# Patient Record
Sex: Female | Born: 1960 | Race: Black or African American | Hispanic: No | Smoking: Never smoker
Health system: Southern US, Community
[De-identification: ages and names within clinical notes are randomized; demographics above are authoritative.]

## PROBLEM LIST (undated history)

## (undated) DIAGNOSIS — N879 Dysplasia of cervix uteri, unspecified: Secondary | ICD-10-CM

## (undated) DIAGNOSIS — I10 Essential (primary) hypertension: Secondary | ICD-10-CM

## (undated) HISTORY — PX: OOPHORECTOMY: SHX86

## (undated) HISTORY — PX: BREAST SURGERY: SHX581

## (undated) HISTORY — PX: COLPOSCOPY: SHX161

## (undated) HISTORY — DX: Essential (primary) hypertension: I10

## (undated) HISTORY — DX: Dysplasia of cervix uteri, unspecified: N87.9

## (undated) HISTORY — PX: HERNIA REPAIR: SHX51

---

## 1992-09-06 HISTORY — PX: OTHER SURGICAL HISTORY: SHX169

## 1997-12-23 ENCOUNTER — Other Ambulatory Visit: Admission: RE | Admit: 1997-12-23 | Discharge: 1997-12-23 | Payer: Self-pay | Admitting: *Deleted

## 1998-10-10 ENCOUNTER — Other Ambulatory Visit: Admission: RE | Admit: 1998-10-10 | Discharge: 1998-10-10 | Payer: Self-pay | Admitting: *Deleted

## 2000-01-11 ENCOUNTER — Other Ambulatory Visit: Admission: RE | Admit: 2000-01-11 | Discharge: 2000-01-11 | Payer: Self-pay | Admitting: *Deleted

## 2002-04-09 ENCOUNTER — Other Ambulatory Visit: Admission: RE | Admit: 2002-04-09 | Discharge: 2002-04-09 | Payer: Self-pay | Admitting: *Deleted

## 2003-04-03 ENCOUNTER — Other Ambulatory Visit: Admission: RE | Admit: 2003-04-03 | Discharge: 2003-04-03 | Payer: Self-pay | Admitting: *Deleted

## 2008-01-28 ENCOUNTER — Emergency Department (HOSPITAL_COMMUNITY): Admission: EM | Admit: 2008-01-28 | Discharge: 2008-01-28 | Payer: Self-pay | Admitting: Family Medicine

## 2008-09-06 HISTORY — PX: OTHER SURGICAL HISTORY: SHX169

## 2008-11-04 ENCOUNTER — Ambulatory Visit (HOSPITAL_BASED_OUTPATIENT_CLINIC_OR_DEPARTMENT_OTHER): Admission: RE | Admit: 2008-11-04 | Discharge: 2008-11-05 | Payer: Self-pay | Admitting: Specialist

## 2008-11-04 ENCOUNTER — Encounter (INDEPENDENT_AMBULATORY_CARE_PROVIDER_SITE_OTHER): Payer: Self-pay | Admitting: Specialist

## 2008-11-26 ENCOUNTER — Ambulatory Visit: Payer: Self-pay | Admitting: Gynecology

## 2009-03-11 ENCOUNTER — Ambulatory Visit: Payer: Self-pay | Admitting: Gynecology

## 2009-03-11 ENCOUNTER — Encounter: Payer: Self-pay | Admitting: Gynecology

## 2009-03-11 ENCOUNTER — Other Ambulatory Visit: Admission: RE | Admit: 2009-03-11 | Discharge: 2009-03-11 | Payer: Self-pay | Admitting: Gynecology

## 2010-03-16 ENCOUNTER — Ambulatory Visit: Payer: Self-pay | Admitting: Gynecology

## 2010-04-08 ENCOUNTER — Other Ambulatory Visit: Admission: RE | Admit: 2010-04-08 | Discharge: 2010-04-08 | Payer: Self-pay | Admitting: Gynecology

## 2010-04-08 ENCOUNTER — Ambulatory Visit: Payer: Self-pay | Admitting: Gynecology

## 2010-08-27 ENCOUNTER — Ambulatory Visit: Payer: Self-pay | Admitting: Gynecology

## 2010-09-25 ENCOUNTER — Ambulatory Visit
Admission: RE | Admit: 2010-09-25 | Discharge: 2010-09-25 | Payer: Self-pay | Source: Home / Self Care | Attending: Gynecology | Admitting: Gynecology

## 2011-01-19 NOTE — Op Note (Signed)
[name], Julie Frederick              ACCOUNT NO.:  [id number]   MEDICAL RECORD NO.:  [id number]          PATIENT TYPE:  AMB   LOCATION:  DSC                          FACILITY:  [facility]   PHYSICIAN:  Earvin Hansen L. 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  She also has in the midline area from  previous surgery, hysterectomy, weakness in the mid portion consistent  with possible ventral herniation.  She also has 4 plus diastasis recti  and lipodystrophy.   PROCEDURE:  Panniculectomy with repair of severe diastasis recti, repair  of ventral hernia.   ANESTHESIA:  General.   Preoperatively, the patient was stood up and drawn for the W-plasty  incision area within the folds of her abdomen.  She underwent general  anesthesia and intubated orally.  A sterile Foley catheter technique was  used.  After prep with sterile towels, tumescent fluid was injected  within the abdominal skin and subcutaneous tissue, a total of 1000 mL.  The W-plasty incision was opened with #15 blade.  The incision was  carried down to Scarpa fascia to underlying areolar tissue overlying the  rectus fascia.  Using the Bovie unit on coagulation, I used it to  dissect over the area up to the umbilicus.  The umbilicus now released  on its own pedicle and the dissection was carried out to the xiphoid  process as well as the right and left upper quadrants.  In the midline  area it was observed a defect measured approximately 4 x 8.5 cm with  weakness.  Since this was below the arcuate line, I was able to plicate  it back together with multiple sutures of 0 Surgilene.   Next, after proper hemostasis, the liposuction was fashioned out  laterally for the lipodystrophy at present.  The severe diastasis recti  was repaired  with a running #1 Prolene from the xiphoid process to the  umbilicus.  This is from the umbilicus to the pubic area.  Attention  then was drawn to the patient.  The patient is placed in Jack-Knife  position.  Excess panniculus was then trimmed.  Hemostasis maintained  with Bovie anticoagulation and then the fascia were laid in and secured  with deep suture of 2-0 Monocryl x2 layers and a running subcuticular  stitches of 3-0 Monocryl.  The wounds were drained with a large Hemovac  drain which was brought to the pubic hair area and threaded throughout  the disk space of the abdominal space and secured with 3-0 Prolene.  The  wounds were cleansed.  The belly button was brought back through and  secured with 2-0 Monocryl subcu and then a running 4-0 Prolene.  Steri-  Strips and sterile dressings were applied including Xeroform, 4 x 4s,  ABDs, and Hypafix tape as well as abdominal support.  She withstood the  procedures very well and was taken to recovery room in  good condition.   ESTIMATED BLOOD LOSS:  Less than 150 mL.   COMPLICATIONS:  None.     Yaakov Guthrie. Shon Hough, M.D.  Electronically Signed    GLT/MEDQ  D:  11/04/2008  T:  11/05/2008  Job:  [id number]

## 2011-03-23 ENCOUNTER — Ambulatory Visit (INDEPENDENT_AMBULATORY_CARE_PROVIDER_SITE_OTHER): Payer: BC Managed Care – PPO | Admitting: Gynecology

## 2011-03-23 DIAGNOSIS — K644 Residual hemorrhoidal skin tags: Secondary | ICD-10-CM

## 2011-03-23 DIAGNOSIS — N898 Other specified noninflammatory disorders of vagina: Secondary | ICD-10-CM

## 2011-03-23 DIAGNOSIS — B373 Candidiasis of vulva and vagina: Secondary | ICD-10-CM

## 2011-06-02 LAB — POCT URINALYSIS DIP (DEVICE)
Bilirubin Urine: NEGATIVE
Ketones, ur: NEGATIVE
Protein, ur: 100 — AB
pH: 7

## 2011-06-18 ENCOUNTER — Ambulatory Visit (INDEPENDENT_AMBULATORY_CARE_PROVIDER_SITE_OTHER): Payer: BC Managed Care – PPO | Admitting: Gynecology

## 2011-06-18 ENCOUNTER — Encounter: Payer: Self-pay | Admitting: Gynecology

## 2011-06-18 VITALS — BP 132/90

## 2011-06-18 DIAGNOSIS — N3941 Urge incontinence: Secondary | ICD-10-CM

## 2011-06-18 DIAGNOSIS — N3281 Overactive bladder: Secondary | ICD-10-CM

## 2011-06-18 DIAGNOSIS — N318 Other neuromuscular dysfunction of bladder: Secondary | ICD-10-CM

## 2011-06-18 DIAGNOSIS — B373 Candidiasis of vulva and vagina: Secondary | ICD-10-CM

## 2011-06-18 DIAGNOSIS — R350 Frequency of micturition: Secondary | ICD-10-CM

## 2011-06-18 DIAGNOSIS — Z23 Encounter for immunization: Secondary | ICD-10-CM

## 2011-06-18 DIAGNOSIS — N898 Other specified noninflammatory disorders of vagina: Secondary | ICD-10-CM

## 2011-06-18 MED ORDER — FLUCONAZOLE 150 MG PO TABS: 150.0000 mg | ORAL_TABLET | Freq: Once | ORAL | Status: AC

## 2011-06-18 NOTE — Progress Notes (Signed)
Addended by: Bertram Savin A on: 06/18/2011 03:20 PM   Modules accepted: Orders

## 2011-06-18 NOTE — Patient Instructions (Signed)
We will call you on Monday it is any growth from the urine culture. Please read the information provided on overactive bladder if the symptoms persist we will give you a trial of the medication Gala Murdoch which she will take 1 tablet daily. I would likely to keep a urinary diary and followup to see me in one month after having started medication.

## 2011-06-18 NOTE — Progress Notes (Signed)
Patient is a 50 year old who presented to the office today stating that over the past week she's had more urinary frequency than usual and also progressively has been having nocturia. She does state that she does have urgency and incontinence which she's had that for quite some time especially even after voiding if she exercises she'll leak a little bit of urine. She denies any dysuria very little discharge no change in sexual partner denies any fever chills nausea vomiting no back discomfort and has normal bowel movements. 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 Literature information was provided on overactive bladder. She will wait to start the medication if and only when the results of the urine culture next week shows that there is no organisms present. She will then return to the office one month after initiating treatment for followup. The wet prep did demonstrate monilia assess and she was given a prescription Diflucan 150 mg.

## 2011-07-27 ENCOUNTER — Telehealth: Payer: Self-pay | Admitting: *Deleted

## 2011-07-27 DIAGNOSIS — N390 Urinary tract infection, site not specified: Secondary | ICD-10-CM

## 2011-07-27 NOTE — Telephone Encounter (Signed)
Patient with signs and symptoms of urinary tract infection. We'll place her on Macrobid one by mouth twice a day for 7 day. We'll also calling Uribell antispasmodic agent to take 1 tablet by mouth 4 times a day for 2 days.

## 2011-07-27 NOTE — Telephone Encounter (Signed)
Pt called c/o UTI s/s frequent urination, cloudy urine. She has no lower back pain nor burning with urination. She was seen on 06/18/11 and u/a result were negative. She was give diflucan for yeast and took as directed. Pt states she was told that if her symptoms should persist that antibody would sent to pharmacy. She is not taking an birth control and had a hysterectomy. Please advise.

## 2011-07-28 MED ORDER — NITROFURANTOIN MONOHYD MACRO 100 MG PO CAPS: 100.0000 mg | ORAL_CAPSULE | Freq: Two times a day (BID) | ORAL | Status: AC

## 2011-07-28 MED ORDER — URIBEL 118 MG PO CAPS: ORAL_CAPSULE | ORAL | Status: DC

## 2011-07-28 NOTE — Telephone Encounter (Signed)
Lm on pt vm that rx has been sent to pharmacy.

## 2011-09-02 ENCOUNTER — Encounter: Payer: Self-pay | Admitting: Gynecology

## 2011-09-02 ENCOUNTER — Ambulatory Visit (INDEPENDENT_AMBULATORY_CARE_PROVIDER_SITE_OTHER): Payer: BC Managed Care – PPO | Admitting: Gynecology

## 2011-09-02 VITALS — BP 136/92

## 2011-09-02 DIAGNOSIS — B373 Candidiasis of vulva and vagina: Secondary | ICD-10-CM

## 2011-09-02 DIAGNOSIS — R82998 Other abnormal findings in urine: Secondary | ICD-10-CM

## 2011-09-02 DIAGNOSIS — N390 Urinary tract infection, site not specified: Secondary | ICD-10-CM

## 2011-09-02 DIAGNOSIS — N898 Other specified noninflammatory disorders of vagina: Secondary | ICD-10-CM

## 2011-09-02 DIAGNOSIS — N760 Acute vaginitis: Secondary | ICD-10-CM

## 2011-09-02 DIAGNOSIS — R300 Dysuria: Secondary | ICD-10-CM

## 2011-09-02 DIAGNOSIS — I10 Essential (primary) hypertension: Secondary | ICD-10-CM

## 2011-09-02 DIAGNOSIS — B9689 Other specified bacterial agents as the cause of diseases classified elsewhere: Secondary | ICD-10-CM

## 2011-09-02 DIAGNOSIS — A499 Bacterial infection, unspecified: Secondary | ICD-10-CM

## 2011-09-02 MED ORDER — TINIDAZOLE 500 MG PO TABS: 2.0000 g | ORAL_TABLET | Freq: Once | ORAL | Status: AC

## 2011-09-02 MED ORDER — SULFAMETHOXAZOLE-TMP DS 800-160 MG PO TABS: 1.0000 | ORAL_TABLET | Freq: Two times a day (BID) | ORAL | Status: AC

## 2011-09-02 NOTE — Patient Instructions (Addendum)
Take antibiotics as prescribed. Follow up if symptoms persist or return.  Follow blood pressure. It remains elevated then you'll need to see a primary physician for follow up and possible treatment.

## 2011-09-02 NOTE — Progress Notes (Signed)
Patient presents with complaints of dysuria. She also noticed her urine is darker and has an odor. No low back pain, fevers chills, vaginal discharge or other symptoms.  Exam with chaperone present Blood pressure 136/92 Spine straight no CVA tenderness Pelvic external BUS vagina with white to yellowish discharge. Cervix normal. Bimanual without masses or tenderness  Assessment and plan: 1. Dysuria. UA is consistent with UTI. We'll treat with Septra DS 1 by mouth twice a day x3 days. Follow up if symptoms persist or recur. 2. Vaginal discharge. Wet was positive for BV. We'll treat with Tindamax 2 g daily x2 doses. Alcohol avoidance discussed. 3. I blood pressure. Patient knows to she's had her blood pressure checked several times and seems to be elevated. It is 136/92. I've asked her to follow with rechecks if it persists and she needs to see a primary to consider treatment. She does have a family history of hypertension she knows importance of follow up.

## 2011-12-10 ENCOUNTER — Ambulatory Visit (INDEPENDENT_AMBULATORY_CARE_PROVIDER_SITE_OTHER): Payer: BC Managed Care – PPO | Admitting: Gynecology

## 2011-12-10 ENCOUNTER — Encounter: Payer: Self-pay | Admitting: Gynecology

## 2011-12-10 VITALS — BP 138/84

## 2011-12-10 DIAGNOSIS — Z131 Encounter for screening for diabetes mellitus: Secondary | ICD-10-CM

## 2011-12-10 DIAGNOSIS — Z113 Encounter for screening for infections with a predominantly sexual mode of transmission: Secondary | ICD-10-CM

## 2011-12-10 DIAGNOSIS — Z13220 Encounter for screening for lipoid disorders: Secondary | ICD-10-CM

## 2011-12-10 NOTE — Patient Instructions (Signed)
Follow up for your annual exam next week and we'll discuss blood testing results.

## 2011-12-10 NOTE — Progress Notes (Signed)
Patient presents requesting HIV screening. Her long-term boyfriend was found to have an indeterminate HIV result and or further evaluating her in the patient is obviously concerned about this. She has no symptoms to suggest immunosuppression. She actually has an appointment next week to see me for her annual exam. I ordered a hepatitis B hepatitis C HIV RPR. I also ordered routine lab work to save her the second blood draw next week which comes for annual to include a CBC urinalysis glucose and lipid profile. Patient will follow up these results as well as for her annual exam next week.

## 2011-12-11 LAB — CBC WITH DIFFERENTIAL/PLATELET
Basophils Absolute: 0 K/uL (ref 0.0–0.1)
Basophils Relative: 1 % (ref 0–1)
Eosinophils Absolute: 0.1 K/uL (ref 0.0–0.7)
Eosinophils Relative: 2 % (ref 0–5)
HCT: 40.6 % (ref 36.0–46.0)
Hemoglobin: 13.3 g/dL (ref 12.0–15.0)
Lymphocytes Relative: 38 % (ref 12–46)
Lymphs Abs: 2.4 K/uL (ref 0.7–4.0)
MCH: 27.9 pg (ref 26.0–34.0)
MCHC: 32.8 g/dL (ref 30.0–36.0)
MCV: 85.3 fL (ref 78.0–100.0)
Monocytes Absolute: 0.4 K/uL (ref 0.1–1.0)
Monocytes Relative: 6 % (ref 3–12)
Neutro Abs: 3.3 K/uL (ref 1.7–7.7)
Neutrophils Relative %: 53 % (ref 43–77)
Platelets: 261 K/uL (ref 150–400)
RBC: 4.76 MIL/uL (ref 3.87–5.11)
RDW: 13.1 % (ref 11.5–15.5)
WBC: 6.3 K/uL (ref 4.0–10.5)

## 2011-12-11 LAB — URINALYSIS W MICROSCOPIC + REFLEX CULTURE
Bacteria, UA: NONE SEEN
Bilirubin Urine: NEGATIVE
Casts: NONE SEEN
Crystals: NONE SEEN
Glucose, UA: NEGATIVE mg/dL
Hgb urine dipstick: NEGATIVE
Ketones, ur: NEGATIVE mg/dL
Leukocytes, UA: NEGATIVE
Nitrite: NEGATIVE
Protein, ur: NEGATIVE mg/dL
Specific Gravity, Urine: 1.007 (ref 1.005–1.030)
Squamous Epithelial / HPF: NONE SEEN
Urobilinogen, UA: 0.2 mg/dL (ref 0.0–1.0)
pH: 6.5 (ref 5.0–8.0)

## 2011-12-11 LAB — LIPID PANEL
Cholesterol: 185 mg/dL (ref 0–200)
HDL: 48 mg/dL
Total CHOL/HDL Ratio: 3.9 ratio
Triglycerides: 422 mg/dL — ABNORMAL HIGH

## 2011-12-11 LAB — GLUCOSE, RANDOM: Glucose, Bld: 91 mg/dL (ref 70–99)

## 2011-12-11 LAB — SYPHILIS: RPR W/REFLEX TO RPR TITER AND TREPONEMAL ANTIBODIES, TRADITIONAL SCREENING AND DIAGNOSIS ALGORITHM

## 2011-12-11 LAB — HEPATITIS B SURFACE ANTIGEN: Hepatitis B Surface Ag: NEGATIVE

## 2011-12-11 LAB — HIV ANTIBODY (ROUTINE TESTING W REFLEX): HIV: NONREACTIVE

## 2011-12-11 LAB — HEPATITIS C ANTIBODY: HCV Ab: NEGATIVE

## 2011-12-13 ENCOUNTER — Telehealth: Payer: Self-pay | Admitting: *Deleted

## 2011-12-13 NOTE — Telephone Encounter (Signed)
Pt informed of all negative test result on 12/10/11.

## 2011-12-13 NOTE — Progress Notes (Signed)
Addended by: Dara Lords on: 12/13/2011 08:56 AM   Modules accepted: Orders

## 2011-12-17 ENCOUNTER — Encounter: Payer: BC Managed Care – PPO | Admitting: Gynecology

## 2011-12-17 ENCOUNTER — Other Ambulatory Visit (HOSPITAL_COMMUNITY)
Admission: RE | Admit: 2011-12-17 | Discharge: 2011-12-17 | Disposition: A | Discharge status: Home / Self Care | Payer: BC Managed Care – PPO | Source: Ambulatory Visit | Attending: Gynecology | Admitting: Gynecology

## 2011-12-17 ENCOUNTER — Other Ambulatory Visit: Payer: BC Managed Care – PPO

## 2011-12-17 ENCOUNTER — Ambulatory Visit (INDEPENDENT_AMBULATORY_CARE_PROVIDER_SITE_OTHER): Payer: BC Managed Care – PPO | Admitting: Gynecology

## 2011-12-17 ENCOUNTER — Encounter: Payer: Self-pay | Admitting: Gynecology

## 2011-12-17 VITALS — BP 128/82 | Ht 61.25 in | Wt 150.0 lb

## 2011-12-17 DIAGNOSIS — Z01419 Encounter for gynecological examination (general) (routine) without abnormal findings: Secondary | ICD-10-CM

## 2011-12-17 DIAGNOSIS — Z13220 Encounter for screening for lipoid disorders: Secondary | ICD-10-CM

## 2011-12-17 LAB — LIPID PANEL
LDL Cholesterol: 92 mg/dL (ref 0–99)
Triglycerides: 152 mg/dL — ABNORMAL HIGH (ref <150)

## 2011-12-17 MED ORDER — MEDROXYPROGESTERONE ACETATE 10 MG PO TABS: 10.0000 mg | ORAL_TABLET | Freq: Every day | ORAL | Status: DC

## 2011-12-17 MED ORDER — ESTROGENS CONJUGATED 0.9 MG PO TABS: 0.9000 mg | ORAL_TABLET | Freq: Every day | ORAL | Status: DC

## 2011-12-17 NOTE — Patient Instructions (Signed)
Follow up for triglyceride results. Otherwise follow up in one year for annual gynecologic exam.

## 2011-12-17 NOTE — Progress Notes (Signed)
Julie Frederick 05-01-1961 [id number]        51 y.o.  for annual exam.  Several issues noted below  Past medical history,surgical history, medications, allergies, family history and social history were all reviewed and documented in the EPIC chart. ROS:  Was performed and pertinent positives and negatives are included in the history.  Exam: Julie Frederick chaperone present Filed Vitals:   12/17/11 1008  BP: 128/82   General appearance  Normal Skin grossly normal Head/Neck normal with no cervical or supraclavicular adenopathy thyroid normal Lungs  clear Cardiac RR, without RMG Abdominal  soft, nontender, without masses, organomegaly or hernia Breasts  examined lying and sitting without masses, retractions, discharge or axillary adenopathy. Pelvic  Ext/BUS/vagina  normal   Cervix  normal  Pap done  Adnexa  Without masses or tenderness    Anus and perineum  normal   Rectovaginal  normal sphincter tone without palpated masses or tenderness.    Assessment/Plan:  51 y.o. female for annual exam.    1. HRT. Patient is on Premarin 0.9 and Provera 10 mg first 14 days of the month. She does have some spotting at the end of the Provera. She's doing well wants to continue. I reviewed options to include continuous progesterone to limit withdrawal bleeding and she is comfortable with her current situation. I reviewed the WHI study with increased risk of stroke heart attack DVT as well as increased risk of breast cancer. She is comfortable with these risks and wants to continue which I think is reasonable given her age and premature loss of ovarian function. I refilled her Premarin 0.9 #90 refill 4, Provera 10 mg #39 refill 4. 2. Pap smear. I did a Pap smear as she has not had one in 2 years. 3. Mammography. Patient had mammogram in October 2012 and she will repeat this coming fall. She'll continue with annual mammography. SBE monthly reviewed. 4. Colonoscopy. I recommended she arrange colonoscopy when she  turns 51 and she agrees to do this. 5. Partner with indeterminate HIV. Her recent HIV check was negative. Her partner will continue to follow up with his physicians to ultimately determine his status. 6. Elevated triglycerides. Her recent blood work showed a triglyceride in the 400 range. She repeated a fasting triglyceride this morning. If remains elevated, we will refer her to a primary physician to manage and she knows the plan. Otherwise assuming she continues well from a gynecologic standpoint she will see me in a year, sooner as needed.     Dara Lords MD, 10:33 AM 12/17/2011

## 2011-12-20 ENCOUNTER — Other Ambulatory Visit: Payer: Self-pay | Admitting: *Deleted

## 2011-12-20 DIAGNOSIS — E7800 Pure hypercholesterolemia, unspecified: Secondary | ICD-10-CM

## 2011-12-24 ENCOUNTER — Other Ambulatory Visit: Payer: BC Managed Care – PPO

## 2012-02-06 ENCOUNTER — Emergency Department (HOSPITAL_COMMUNITY): Payer: No Typology Code available for payment source

## 2012-02-06 ENCOUNTER — Encounter (HOSPITAL_COMMUNITY): Payer: Self-pay | Admitting: *Deleted

## 2012-02-06 ENCOUNTER — Emergency Department (HOSPITAL_COMMUNITY)
Admission: EM | Admit: 2012-02-06 | Discharge: 2012-02-06 | Disposition: A | Discharge status: Home / Self Care | Payer: No Typology Code available for payment source | Attending: Emergency Medicine | Admitting: Emergency Medicine

## 2012-02-06 DIAGNOSIS — IMO0002 Reserved for concepts with insufficient information to code with codable children: Secondary | ICD-10-CM | POA: Insufficient documentation

## 2012-02-06 DIAGNOSIS — Y92410 Unspecified street and highway as the place of occurrence of the external cause: Secondary | ICD-10-CM | POA: Insufficient documentation

## 2012-02-06 DIAGNOSIS — M542 Cervicalgia: Secondary | ICD-10-CM | POA: Insufficient documentation

## 2012-02-06 DIAGNOSIS — S0081XA Abrasion of other part of head, initial encounter: Secondary | ICD-10-CM

## 2012-02-06 DIAGNOSIS — S20219A Contusion of unspecified front wall of thorax, initial encounter: Secondary | ICD-10-CM | POA: Insufficient documentation

## 2012-02-06 MED ORDER — HYDROCODONE-ACETAMINOPHEN 5-325 MG PO TABS: 1.0000 | ORAL_TABLET | Freq: Four times a day (QID) | ORAL | Status: AC | PRN

## 2012-02-06 MED ORDER — HYDROCODONE-ACETAMINOPHEN 5-325 MG PO TABS
1.0000 | ORAL_TABLET | ORAL | Status: AC
  Administered 2012-02-06: 1 via ORAL
  Filled 2012-02-06: qty 1
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
## 2012-02-14 ENCOUNTER — Telehealth: Payer: Self-pay | Admitting: *Deleted

## 2012-02-14 NOTE — Telephone Encounter (Signed)
Pt called requesting PCP office number, I told [facility], pt said Julie Frederick is name TF gave her number given to set appointment.

## 2012-02-29 ENCOUNTER — Ambulatory Visit: Payer: No Typology Code available for payment source | Admitting: Physical Therapy

## 2012-02-29 ENCOUNTER — Ambulatory Visit: Payer: No Typology Code available for payment source | Attending: Family Medicine | Admitting: Physical Therapy

## 2012-02-29 DIAGNOSIS — M2550 Pain in unspecified joint: Secondary | ICD-10-CM | POA: Insufficient documentation

## 2012-02-29 DIAGNOSIS — IMO0001 Reserved for inherently not codable concepts without codable children: Secondary | ICD-10-CM | POA: Insufficient documentation

## 2012-02-29 DIAGNOSIS — M2560 Stiffness of unspecified joint, not elsewhere classified: Secondary | ICD-10-CM | POA: Insufficient documentation

## 2012-03-07 ENCOUNTER — Ambulatory Visit: Payer: No Typology Code available for payment source | Attending: Family Medicine | Admitting: Physical Therapy

## 2012-03-07 DIAGNOSIS — IMO0001 Reserved for inherently not codable concepts without codable children: Secondary | ICD-10-CM | POA: Insufficient documentation

## 2012-03-07 DIAGNOSIS — M2550 Pain in unspecified joint: Secondary | ICD-10-CM | POA: Insufficient documentation

## 2012-03-07 DIAGNOSIS — M2560 Stiffness of unspecified joint, not elsewhere classified: Secondary | ICD-10-CM | POA: Insufficient documentation

## 2012-03-13 ENCOUNTER — Encounter: Payer: BC Managed Care – PPO | Admitting: Physical Therapy

## 2012-03-13 ENCOUNTER — Ambulatory Visit: Payer: No Typology Code available for payment source | Admitting: Physical Therapy

## 2012-03-14 ENCOUNTER — Ambulatory Visit: Payer: No Typology Code available for payment source | Admitting: Rehabilitative and Restorative Service Providers"

## 2012-03-15 ENCOUNTER — Encounter: Payer: BC Managed Care – PPO | Admitting: Physical Therapy

## 2012-03-20 ENCOUNTER — Ambulatory Visit: Payer: No Typology Code available for payment source

## 2012-03-23 ENCOUNTER — Ambulatory Visit: Payer: No Typology Code available for payment source | Admitting: Physical Therapy

## 2012-03-27 ENCOUNTER — Ambulatory Visit: Payer: No Typology Code available for payment source | Admitting: Physical Therapy

## 2012-03-28 ENCOUNTER — Encounter: Payer: BC Managed Care – PPO | Admitting: Physical Therapy

## 2012-03-28 ENCOUNTER — Ambulatory Visit: Payer: No Typology Code available for payment source | Admitting: Physical Therapy

## 2012-04-03 ENCOUNTER — Ambulatory Visit: Payer: No Typology Code available for payment source | Admitting: Rehabilitation

## 2012-04-04 ENCOUNTER — Ambulatory Visit: Payer: No Typology Code available for payment source | Admitting: Physical Therapy

## 2012-04-11 ENCOUNTER — Ambulatory Visit: Payer: BC Managed Care – PPO | Admitting: Physical Therapy

## 2012-04-11 ENCOUNTER — Ambulatory Visit: Payer: BC Managed Care – PPO | Attending: Family Medicine | Admitting: Physical Therapy

## 2012-04-11 DIAGNOSIS — M2560 Stiffness of unspecified joint, not elsewhere classified: Secondary | ICD-10-CM | POA: Insufficient documentation

## 2012-04-11 DIAGNOSIS — M2550 Pain in unspecified joint: Secondary | ICD-10-CM | POA: Insufficient documentation

## 2012-04-11 DIAGNOSIS — IMO0001 Reserved for inherently not codable concepts without codable children: Secondary | ICD-10-CM | POA: Insufficient documentation

## 2012-04-12 ENCOUNTER — Ambulatory Visit: Payer: BC Managed Care – PPO | Admitting: Rehabilitative and Restorative Service Providers"

## 2012-04-17 ENCOUNTER — Ambulatory Visit: Payer: BC Managed Care – PPO

## 2012-04-25 ENCOUNTER — Ambulatory Visit: Payer: BC Managed Care – PPO | Admitting: Rehabilitative and Restorative Service Providers"

## 2012-04-28 ENCOUNTER — Ambulatory Visit: Payer: BC Managed Care – PPO | Admitting: Rehabilitation

## 2012-05-01 ENCOUNTER — Ambulatory Visit: Payer: BC Managed Care – PPO | Admitting: Rehabilitative and Restorative Service Providers"

## 2012-05-02 ENCOUNTER — Encounter: Payer: BC Managed Care – PPO | Admitting: Rehabilitation

## 2012-05-03 ENCOUNTER — Other Ambulatory Visit: Payer: Self-pay | Admitting: Gynecology

## 2012-05-10 ENCOUNTER — Ambulatory Visit: Payer: BC Managed Care – PPO | Attending: Family Medicine | Admitting: Physical Therapy

## 2012-05-10 DIAGNOSIS — M2560 Stiffness of unspecified joint, not elsewhere classified: Secondary | ICD-10-CM | POA: Insufficient documentation

## 2012-05-10 DIAGNOSIS — IMO0001 Reserved for inherently not codable concepts without codable children: Secondary | ICD-10-CM | POA: Insufficient documentation

## 2012-05-10 DIAGNOSIS — M2550 Pain in unspecified joint: Secondary | ICD-10-CM | POA: Insufficient documentation

## 2012-06-13 ENCOUNTER — Encounter: Payer: Self-pay | Admitting: Gynecology

## 2012-09-05 ENCOUNTER — Encounter: Payer: Self-pay | Admitting: Gynecology

## 2012-09-05 ENCOUNTER — Ambulatory Visit (INDEPENDENT_AMBULATORY_CARE_PROVIDER_SITE_OTHER): Payer: BC Managed Care – PPO | Admitting: Gynecology

## 2012-09-05 DIAGNOSIS — N949 Unspecified condition associated with female genital organs and menstrual cycle: Secondary | ICD-10-CM

## 2012-09-05 DIAGNOSIS — R102 Pelvic and perineal pain: Secondary | ICD-10-CM

## 2012-09-05 DIAGNOSIS — IMO0002 Reserved for concepts with insufficient information to code with codable children: Secondary | ICD-10-CM

## 2012-09-05 DIAGNOSIS — Z79890 Hormone replacement therapy (postmenopausal): Secondary | ICD-10-CM

## 2012-09-05 DIAGNOSIS — N898 Other specified noninflammatory disorders of vagina: Secondary | ICD-10-CM

## 2012-09-05 LAB — URINALYSIS W MICROSCOPIC + REFLEX CULTURE
Bilirubin Urine: NEGATIVE
Casts: NONE SEEN
Glucose, UA: NEGATIVE mg/dL
Ketones, ur: NEGATIVE mg/dL
Leukocytes, UA: NEGATIVE
WBC, UA: NONE SEEN WBC/hpf (ref <3)
pH: 5.5 (ref 5.0–8.0)

## 2012-09-05 LAB — WET PREP FOR TRICH, YEAST, CLUE: Yeast Wet Prep HPF POC: NONE SEEN

## 2012-09-05 MED ORDER — ESTROGENS CONJUGATED 0.9 MG PO TABS: 0.9000 mg | ORAL_TABLET | Freq: Every day | ORAL | Status: DC

## 2012-09-05 MED ORDER — MEDROXYPROGESTERONE ACETATE 10 MG PO TABS: 10.0000 mg | ORAL_TABLET | Freq: Every day | ORAL | Status: DC

## 2012-09-05 NOTE — Patient Instructions (Signed)
Follow up for ultrasound as scheduled 

## 2012-09-05 NOTE — Progress Notes (Signed)
Patient presents with two-day history of pelvic pressure.  Not sure is due to abdominal exercises she is doing or something else. No urinary symptoms such as frequency dysuria or urgency. Slight discharge no odor or itching. Is on HRT to include monthly Provera withdrawal. She has not done this though for several months. No bleeding. She is status post supracervical hysterectomy BSO in the past.  Exam with block assistant Spine straight no CVA tenderness Abdomen soft nontender without masses guarding rebound organomegaly Pelvic external BUS vagina normal. Cervix normal. Bimanual is some fullness at the cervix/cuff. No tenderness. Rectovaginal normal.  Assessment and plan: Some lower pelvic pressure. Urinalysis unremarkable. Wet prep negative. Questionable fullness at cervix. We'll start with ultrasound for better definition. Patient will follow for this and we'll go from there. She does note some deep dyspareunia with deep penetration.  I refilled her HRT for several months as she is due in April for her annual. Next rest and need to take the intermittent progesterone withdrawal she does have some spotting after taking the progesterone.

## 2012-09-05 NOTE — Addendum Note (Signed)
Addended by: Bertram Savin A on: 09/05/2012 12:55 PM   Modules accepted: Orders

## 2012-09-07 LAB — URINE CULTURE: Colony Count: NO GROWTH

## 2012-09-25 ENCOUNTER — Ambulatory Visit (INDEPENDENT_AMBULATORY_CARE_PROVIDER_SITE_OTHER): Payer: BC Managed Care – PPO | Admitting: Gynecology

## 2012-09-25 ENCOUNTER — Ambulatory Visit (INDEPENDENT_AMBULATORY_CARE_PROVIDER_SITE_OTHER): Payer: BC Managed Care – PPO

## 2012-09-25 ENCOUNTER — Encounter: Payer: Self-pay | Admitting: Gynecology

## 2012-09-25 DIAGNOSIS — N888 Other specified noninflammatory disorders of cervix uteri: Secondary | ICD-10-CM

## 2012-09-25 DIAGNOSIS — N9983 Residual ovary syndrome: Secondary | ICD-10-CM

## 2012-09-25 DIAGNOSIS — R102 Pelvic and perineal pain: Secondary | ICD-10-CM

## 2012-09-25 DIAGNOSIS — IMO0002 Reserved for concepts with insufficient information to code with codable children: Secondary | ICD-10-CM

## 2012-09-25 DIAGNOSIS — N72 Inflammatory disease of cervix uteri: Secondary | ICD-10-CM

## 2012-09-25 DIAGNOSIS — N838 Other noninflammatory disorders of ovary, fallopian tube and broad ligament: Secondary | ICD-10-CM

## 2012-09-25 DIAGNOSIS — N949 Unspecified condition associated with female genital organs and menstrual cycle: Secondary | ICD-10-CM

## 2012-09-25 NOTE — Patient Instructions (Signed)
Follow up as needed otherwise in one year for your annual exam.

## 2012-09-25 NOTE — Progress Notes (Signed)
Patient presents for ultrasound due to pelvic pressure symptoms. Also has deep dyspareunia. Ultrasound shows normal appearing cervical stump. No masses or cysts. 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 Left adnexa is negative.  Assessment and plan: Pelvic pressure symptoms which I think is due to exercise. She is having consistent deep dyspareunia with worse with certain positions. I reviewed options to include trachelectomy. The risks to include realistic risk of damage to surrounding structures such as bladder rectum and intestines discussed. Patient is not interested in surgery he would prefer just to monitor at present. I did review the right ovarian remnant with her. She had a TAH/BSO emergently after delivery due to hemorrhage which was felt to be due to a placenta accreta. It appears that a small portion of the right ovary remains. I discussed the long-term issues to include the risk of ovarian cancer with this and she understands and accepts this. Patient will follow per year for her annual see her as needed.

## 2013-04-05 ENCOUNTER — Ambulatory Visit (INDEPENDENT_AMBULATORY_CARE_PROVIDER_SITE_OTHER): Payer: BC Managed Care – PPO | Admitting: Gynecology

## 2013-04-05 ENCOUNTER — Encounter: Payer: Self-pay | Admitting: Gynecology

## 2013-04-05 DIAGNOSIS — R21 Rash and other nonspecific skin eruption: Secondary | ICD-10-CM

## 2013-04-05 MED ORDER — NYSTATIN-TRIAMCINOLONE 100000-0.1 UNIT/GM-% EX OINT: TOPICAL_OINTMENT | Freq: Two times a day (BID) | CUTANEOUS | Status: DC

## 2013-04-05 NOTE — Progress Notes (Signed)
Patient presents having noticed a rash under her left breast the past week or so. She's been putting nystatin cream on it and it seems to be getting better.  Exam was Julie Frederick Anterior chest/both breasts examined lying and sitting. Slight erythematous rash under the left breast consistent with fungal. No masses retractions discharge adenopathy bilaterally.  Assessment and plan: Fungal skin rash under left breast getting better. Mytrex cream prescribed to apply twice a day as needed. Followup if rash persists or recurs. Patient is overdue for her annual and she was reminded and encouraged to make that appointment today.

## 2013-04-05 NOTE — Patient Instructions (Signed)
Apply cream under breasts for the rash. Followup if it persists Schedule your annual exam as you are overdue.

## 2013-06-18 ENCOUNTER — Other Ambulatory Visit: Payer: Self-pay | Admitting: Gynecology

## 2013-06-20 ENCOUNTER — Telehealth: Payer: Self-pay | Admitting: *Deleted

## 2013-06-20 MED ORDER — NYSTATIN-TRIAMCINOLONE 100000-0.1 UNIT/GM-% EX OINT: TOPICAL_OINTMENT | Freq: Two times a day (BID) | CUTANEOUS | Status: DC

## 2013-06-20 NOTE — Telephone Encounter (Signed)
Pt was given Rx for mycology cream on OV 04/05/13, pt never picked up Rx, cream will be resent to pharmacy

## 2013-07-06 ENCOUNTER — Ambulatory Visit (INDEPENDENT_AMBULATORY_CARE_PROVIDER_SITE_OTHER): Payer: BC Managed Care – PPO | Admitting: Gynecology

## 2013-07-06 ENCOUNTER — Encounter: Payer: Self-pay | Admitting: Gynecology

## 2013-07-06 VITALS — BP 120/78 | Ht 60.0 in | Wt 149.0 lb

## 2013-07-06 DIAGNOSIS — Z01419 Encounter for gynecological examination (general) (routine) without abnormal findings: Secondary | ICD-10-CM

## 2013-07-06 DIAGNOSIS — Z79890 Hormone replacement therapy (postmenopausal): Secondary | ICD-10-CM

## 2013-07-06 MED ORDER — ESTROGENS CONJUGATED 0.9 MG PO TABS: 0.9000 mg | ORAL_TABLET | Freq: Every day | ORAL | Status: DC

## 2013-07-06 MED ORDER — MEDROXYPROGESTERONE ACETATE 10 MG PO TABS: 10.0000 mg | ORAL_TABLET | Freq: Every day | ORAL | Status: DC

## 2013-07-06 NOTE — Progress Notes (Signed)
Julie Frederick June 27, 1961 [id number]        52 y.o.  G1P1003 for annual exam.  Several issues noted below.  Past medical history,surgical history, problem list, medications, allergies, family history and social history were all reviewed and documented in the EPIC chart.  ROS:  Performed and pertinent positives and negatives are included in the history, assessment and plan .  Exam: Kim assistant Filed Vitals:   07/06/13 1544  BP: 120/78  Height: 5' (1.524 m)  Weight: 149 lb (67.586 kg)   General appearance  Normal Skin grossly normal Head/Neck normal with no cervical or supraclavicular adenopathy thyroid normal Lungs  clear Cardiac RR, without RMG Abdominal  soft, nontender, without masses, organomegaly or hernia Breasts  examined lying and sitting without masses, retractions, discharge or axillary adenopathy. Pelvic  Ext/BUS/vagina  normal  Cervix  normal  Adnexa  Without masses or tenderness    Anus and perineum  normal   Rectovaginal  normal sphincter tone without palpated masses or tenderness.    Assessment/Plan:  52 y.o. G2P1003 female for annual exam.   1. Status post supracervical hysterectomy BSO due to postpartum hemorrhage 1994 with placenta accreta. Is on HRT consisting of Premarin 0.9 mg daily and probe irritation milligrams first 14 days of each month. Does have spotting at the end of her Provera.  I again reviewed the whole issue of HRT with her to include the WHI study with increased risk of stroke, heart attack, DVT and breast cancer. The ACOG and NAMS statements for lowest dose for the shortest period of time reviewed. Transdermal versus oral first-pass effect benefit discussed.  Patient stoppable continuing at this point and wants to continue and I refilled her x1 year. 2. DEXA 2009 normal. Recommend repeat at age 52. Increase calcium vitamin D reviewed. 3. Mammography 06/2013. Recommend continue annual mammography. SBE monthly reviewed. 4. Pap smear 2013 normal.  No Pap smear done today. No history of abnormal Pap smear previously. Plan repeat Pap smear at 3 year interval. 5. Colonoscopy never. Recommended screening colonoscopy as she is over the age of 40 and she agrees to schedule this. 6. Health maintenance. Patient has routine blood work done through her work. Offered vitamin D TSH comprehensive metabolic panel which were not done but she declines. Followup one year, sooner as needed.   Note: This document was prepared with digital dictation and possible smart phrase technology. Any transcriptional errors that result from this process are unintentional.   Dara Lords MD, 4:39 PM 07/06/2013

## 2013-07-06 NOTE — Patient Instructions (Addendum)
Schedule colonoscopy with [facility] at [phone] or [facility] at [phone] Followup in one year for annual gynecologic exam.

## 2013-07-16 ENCOUNTER — Other Ambulatory Visit: Payer: Self-pay | Admitting: Gynecology

## 2013-09-03 ENCOUNTER — Encounter: Payer: Self-pay | Admitting: Women's Health

## 2013-09-03 ENCOUNTER — Ambulatory Visit (INDEPENDENT_AMBULATORY_CARE_PROVIDER_SITE_OTHER): Payer: BC Managed Care – PPO | Admitting: Women's Health

## 2013-09-03 DIAGNOSIS — N760 Acute vaginitis: Secondary | ICD-10-CM

## 2013-09-03 DIAGNOSIS — A499 Bacterial infection, unspecified: Secondary | ICD-10-CM

## 2013-09-03 DIAGNOSIS — B9689 Other specified bacterial agents as the cause of diseases classified elsewhere: Secondary | ICD-10-CM

## 2013-09-03 DIAGNOSIS — N949 Unspecified condition associated with female genital organs and menstrual cycle: Secondary | ICD-10-CM

## 2013-09-03 DIAGNOSIS — N898 Other specified noninflammatory disorders of vagina: Secondary | ICD-10-CM

## 2013-09-03 LAB — WET PREP FOR TRICH, YEAST, CLUE
Trich, Wet Prep: NONE SEEN
WBC, Wet Prep HPF POC: NONE SEEN

## 2013-09-03 MED ORDER — METRONIDAZOLE 0.75 % VA GEL: 1.0000 | Freq: Two times a day (BID) | VAGINAL | Status: DC

## 2013-09-03 NOTE — Progress Notes (Signed)
Patient ID: Julie Frederick, female   DOB: [birth year], 52 y.o.   MRN: [id number] Presents with complaint of vaginal discharge with odor and slight dyspareunia for 1 week. Denies urinary symptoms, abdominal pain or fever. Same partner. 1994 TAH supracervical for postpartum hemorrhage. On HRT.  Exam: Appears well. External genitalia within normal limits, and no visible erythema or discharge. Speculum exam scant discharge with no erythema noted. Wet prep positive for amines, clues, and TNTC bacteria. Bimanual no adnexal fullness or tenderness.  Bacteria vaginosis  Plan: MetroGel vaginal cream 1 applicator at bedtime x5, alcohol precautions reviewed. Instructed to call if no relief of discharge.

## 2013-09-03 NOTE — Patient Instructions (Signed)
Bacterial Vaginosis Bacterial vaginosis is an infection of the vagina. A healthy vagina has many kinds of good germs (bacteria). Sometimes the number of good germs can change. This allows bad germs to move in and cause an infection. You may be given medicine (antibiotics) to treat the infection. Or, you may not need treatment at all. HOME CARE  Take your medicine as told. Finish them even if you start to feel better.  Do not have sex until you finish your medicine.  Do not douche.  Practice safe sex.  Tell your sex partner that you have an infection. They should see their doctor for treatment if they have problems. GET HELP RIGHT AWAY IF:  You do not get better after 3 days of treatment.  You have grey fluid (discharge) coming from your vagina.  You have pain.  You have a temperature of 102 F (38.9 C) or higher. MAKE SURE YOU:   Understand these instructions.  Will watch your condition.  Will get help right away if you are not doing well or get worse. Document Released: 06/01/2008 Document Revised: 11/15/2011 Document Reviewed: 04/04/2013 ExitCare Patient Information 2014 [employer].  

## 2013-09-08 ENCOUNTER — Other Ambulatory Visit: Payer: Self-pay | Admitting: Gynecology

## 2013-12-10 ENCOUNTER — Telehealth: Payer: Self-pay | Admitting: *Deleted

## 2013-12-10 MED ORDER — ESTROGENS CONJUGATED 0.9 MG PO TABS: 0.9000 mg | ORAL_TABLET | Freq: Every day | ORAL | Status: DC

## 2013-12-10 NOTE — Telephone Encounter (Signed)
Pt called requesting 90 day supply for premarin vaginal cream, rx sent

## 2014-01-22 ENCOUNTER — Telehealth: Payer: Self-pay | Admitting: *Deleted

## 2014-01-22 NOTE — Telephone Encounter (Signed)
Ok, Dr Audie BoxFontaine had treated her in 2012 with septra DS twice daily for 3 days #6 - please call in and if not better office visit upon her return.

## 2014-01-22 NOTE — Telephone Encounter (Signed)
Pt informed, rx called in 

## 2014-01-22 NOTE — Telephone Encounter (Signed)
Pt called in [place] c/o early UTI, c/o continues urgency works at [employer], [place]. At [phone]

## 2014-01-23 MED ORDER — SULFAMETHOXAZOLE-TMP DS 800-160 MG PO TABS: 1.0000 | ORAL_TABLET | Freq: Two times a day (BID) | ORAL | Status: DC

## 2014-03-07 ENCOUNTER — Ambulatory Visit (INDEPENDENT_AMBULATORY_CARE_PROVIDER_SITE_OTHER): Payer: BC Managed Care – PPO | Admitting: Gynecology

## 2014-03-07 ENCOUNTER — Encounter: Payer: Self-pay | Admitting: Gynecology

## 2014-03-07 DIAGNOSIS — N393 Stress incontinence (female) (male): Secondary | ICD-10-CM

## 2014-03-07 DIAGNOSIS — R350 Frequency of micturition: Secondary | ICD-10-CM

## 2014-03-07 LAB — URINALYSIS W MICROSCOPIC + REFLEX CULTURE
BILIRUBIN URINE: NEGATIVE
CASTS: NONE SEEN
Crystals: NONE SEEN
GLUCOSE, UA: NEGATIVE mg/dL
Ketones, ur: NEGATIVE mg/dL
LEUKOCYTES UA: NEGATIVE
Nitrite: NEGATIVE
Protein, ur: NEGATIVE mg/dL
Specific Gravity, Urine: 1.01 (ref 1.005–1.030)
Urobilinogen, UA: 0.2 mg/dL (ref 0.0–1.0)
pH: 6 (ref 5.0–8.0)

## 2014-03-07 MED ORDER — SULFAMETHOXAZOLE-TRIMETHOPRIM 800-160 MG PO TABS: 1.0000 | ORAL_TABLET | Freq: Two times a day (BID) | ORAL | Status: DC

## 2014-03-07 MED ORDER — MEDROXYPROGESTERONE ACETATE 10 MG PO TABS: 10.0000 mg | ORAL_TABLET | Freq: Every day | ORAL | Status: DC

## 2014-03-07 NOTE — Patient Instructions (Signed)
Take antibiotic twice daily for 3 days. Call if your urinary symptoms persist.

## 2014-03-07 NOTE — Progress Notes (Signed)
Julie Frederick [birth year] [id number]        53 y.o.  G1P1003 presents with several week history of urinary incontinence with exercise and intercourse. Also some frequency with urination. Has a history of slight loss of urine consistent with stress incontinence. [facility]. Has been monitoring the symptoms seem worse over the last 2-3 weeks. No fever chills significant dysuria or pelvic pain.  Past medical history,surgical history, problem list, medications, allergies, family history and social history were all reviewed and documented in the EPIC chart.  Directed ROS with pertinent positives and negatives documented in the history of present illness/assessment and plan.  Exam: Kim assistant General appearance:  Normal Spine: Straight without CVA tenderness Abdomen: Soft nontender without masses guarding or rebound Pelvic: External BUS vagina normal. Cervix normal. Bimanual without masses or tenderness.  Assessment/Plan:  53 y.o. G1P1003 exacerbation of stress incontinence symptoms. Urinalysis suggest low-grade UTI with few bacteria 0-2 WBC 3-6 RBC. We'll cover with antibiotics Septra DS 1 by mouth twice a day x3 days. Followup on urine culture. Followup if symptoms persist, worsen or recur.   Note: This document was prepared with digital dictation and possible smart phrase technology. Any transcriptional errors that result from this process are unintentional.   Dara LordsFONTAINE,Julie Shi P MD, 9:48 AM 03/07/2014

## 2014-03-08 LAB — URINE CULTURE
COLONY COUNT: NO GROWTH
ORGANISM ID, BACTERIA: NO GROWTH

## 2014-03-11 ENCOUNTER — Other Ambulatory Visit: Payer: Self-pay | Admitting: Gynecology

## 2014-03-11 DIAGNOSIS — N3001 Acute cystitis with hematuria: Secondary | ICD-10-CM

## 2014-07-08 ENCOUNTER — Encounter: Payer: Self-pay | Admitting: Gynecology

## 2014-07-23 ENCOUNTER — Encounter: Payer: Self-pay | Admitting: Gynecology

## 2014-08-06 ENCOUNTER — Encounter: Payer: Self-pay | Admitting: Women's Health

## 2014-08-06 ENCOUNTER — Ambulatory Visit (INDEPENDENT_AMBULATORY_CARE_PROVIDER_SITE_OTHER): Payer: BC Managed Care – PPO | Admitting: Women's Health

## 2014-08-06 VITALS — BP 128/80 | Wt 141.0 lb

## 2014-08-06 DIAGNOSIS — B373 Candidiasis of vulva and vagina: Secondary | ICD-10-CM

## 2014-08-06 DIAGNOSIS — A499 Bacterial infection, unspecified: Secondary | ICD-10-CM

## 2014-08-06 DIAGNOSIS — B3731 Acute candidiasis of vulva and vagina: Secondary | ICD-10-CM

## 2014-08-06 DIAGNOSIS — N760 Acute vaginitis: Secondary | ICD-10-CM

## 2014-08-06 DIAGNOSIS — R350 Frequency of micturition: Secondary | ICD-10-CM

## 2014-08-06 DIAGNOSIS — B9689 Other specified bacterial agents as the cause of diseases classified elsewhere: Secondary | ICD-10-CM

## 2014-08-06 LAB — URINALYSIS W MICROSCOPIC + REFLEX CULTURE
Bilirubin Urine: NEGATIVE
CASTS: NONE SEEN
Crystals: NONE SEEN
GLUCOSE, UA: NEGATIVE mg/dL
KETONES UR: NEGATIVE mg/dL
LEUKOCYTES UA: NEGATIVE
Nitrite: NEGATIVE
PH: 7 (ref 5.0–8.0)
Protein, ur: NEGATIVE mg/dL
Specific Gravity, Urine: 1.01 (ref 1.005–1.030)
Urobilinogen, UA: 0.2 mg/dL (ref 0.0–1.0)
WBC, UA: NONE SEEN WBC/hpf (ref <3)

## 2014-08-06 LAB — WET PREP FOR TRICH, YEAST, CLUE: Trich, Wet Prep: NONE SEEN

## 2014-08-06 MED ORDER — FLUCONAZOLE 150 MG PO TABS: 150.0000 mg | ORAL_TABLET | Freq: Once | ORAL | Status: DC

## 2014-08-06 MED ORDER — METRONIDAZOLE 0.75 % VA GEL: VAGINAL | Status: DC

## 2014-08-06 NOTE — Patient Instructions (Signed)
Bacterial Vaginosis Bacterial vaginosis is an infection of the vagina. It happens when too many of certain germs (bacteria) grow in the vagina. HOME CARE  Take your medicine as told by your doctor.  Finish your medicine even if you start to feel better.  Do not have sex until you finish your medicine and are better.  Tell your sex partner that you have an infection. They should see their doctor for treatment.  Practice safe sex. Use condoms. Have only one sex partner. GET HELP IF:  You are not getting better after 3 days of treatment.  You have more grey fluid (discharge) coming from your vagina than before.  You have more pain than before.  You have a fever. MAKE SURE YOU:   Understand these instructions.  Will watch your condition.  Will get help right away if you are not doing well or get worse. Document Released: 06/01/2008 Document Revised: 06/13/2013 Document Reviewed: 04/04/2013 [facility] Patient Information 2015 [place] [place]. This information is not intended to replace advice given to you by your health care provider. Make sure you discuss any questions you have with your health care provider. bac

## 2014-08-06 NOTE — Progress Notes (Signed)
Patient ID: Julie Frederick, female   DOB: 07-10-1961, 53 y.o.   MRN: [id number] Presents with complaint of increased vaginal discharge with questionable bloody discharge. Denies urinary symptoms, abdominal pain or fever. 1994  supracervical TVH with BSO for postpartum hemorrhage. On Premarin 0.9 daily and Provera 10 day 1 through 13 every 2-3 months. Reports does have cramping with spotting after Provera.  Exam: Appears well. External genitalia within normal limits, speculum exam cervix healthy, scant discharge, wet prep positive for few yeast, few clue cells, TNTC bacteria. Bimanual no CMT. UA: Trace blood, no wbc's, 0-2 RBCs, rare bacteria.  Bacteria vaginosis Yeast vaginosis  Plan: MetroGel vaginal cream 1 applicator at bedtime 5, alcohol precautions reviewed. Diflucan 150 by mouth 1 dose. Instructed to call if no relief of discharge.

## 2014-08-19 ENCOUNTER — Other Ambulatory Visit: Payer: Self-pay | Admitting: Gynecology

## 2014-08-27 ENCOUNTER — Telehealth: Payer: Self-pay | Admitting: *Deleted

## 2014-08-27 NOTE — Telephone Encounter (Signed)
Pt called c/o coughing only for about 1 week now, pt said she is coughing up greenish mucus. Called requesting Rx, I explained to pt best to see PCP, has no PCP, traveling now in [place]. I advise pt to stop by urgent care to been seen for treatment, pt agreed.

## 2014-10-03 ENCOUNTER — Other Ambulatory Visit: Payer: Self-pay | Admitting: Gynecology

## 2014-10-03 ENCOUNTER — Telehealth: Payer: Self-pay | Admitting: *Deleted

## 2014-10-03 MED ORDER — ESTROGENS CONJUGATED 0.9 MG PO TABS: 0.9000 mg | ORAL_TABLET | Freq: Every day | ORAL | Status: DC

## 2014-10-03 NOTE — Telephone Encounter (Signed)
Pt has annual scheduled on 11/14/14 requesting refill on premarin 0.9 mg, Rx sent.

## 2014-11-14 ENCOUNTER — Other Ambulatory Visit (HOSPITAL_COMMUNITY)
Admission: RE | Admit: 2014-11-14 | Discharge: 2014-11-14 | Disposition: A | Discharge status: Home / Self Care | Payer: BLUE CROSS/BLUE SHIELD | Source: Ambulatory Visit | Attending: Gynecology | Admitting: Gynecology

## 2014-11-14 ENCOUNTER — Encounter: Payer: Self-pay | Admitting: Gynecology

## 2014-11-14 ENCOUNTER — Ambulatory Visit (INDEPENDENT_AMBULATORY_CARE_PROVIDER_SITE_OTHER): Payer: BLUE CROSS/BLUE SHIELD | Admitting: Gynecology

## 2014-11-14 VITALS — BP 126/80 | Ht 61.0 in | Wt 155.0 lb

## 2014-11-14 DIAGNOSIS — Z79890 Hormone replacement therapy (postmenopausal): Secondary | ICD-10-CM

## 2014-11-14 DIAGNOSIS — Z01419 Encounter for gynecological examination (general) (routine) without abnormal findings: Secondary | ICD-10-CM | POA: Insufficient documentation

## 2014-11-14 LAB — LIPID PANEL
CHOL/HDL RATIO: 2.5 ratio
CHOLESTEROL: 140 mg/dL (ref 0–200)
HDL: 55 mg/dL (ref >=46)
LDL Cholesterol: 53 mg/dL (ref 0–99)
TRIGLYCERIDES: 162 mg/dL — AB (ref <150)
VLDL: 32 mg/dL (ref 0–40)

## 2014-11-14 LAB — CBC WITH DIFFERENTIAL/PLATELET
BASOS PCT: 0 % (ref 0–1)
Basophils Absolute: 0 10*3/uL (ref 0.0–0.1)
EOS ABS: 0.1 10*3/uL (ref 0.0–0.7)
EOS PCT: 2 % (ref 0–5)
HCT: 38.3 % (ref 36.0–46.0)
Hemoglobin: 12.8 g/dL (ref 12.0–15.0)
LYMPHS PCT: 39 % (ref 12–46)
Lymphs Abs: 2 10*3/uL (ref 0.7–4.0)
MCH: 28 pg (ref 26.0–34.0)
MCHC: 33.4 g/dL (ref 30.0–36.0)
MCV: 83.8 fL (ref 78.0–100.0)
MPV: 9.6 fL (ref 8.6–12.4)
Monocytes Absolute: 0.5 10*3/uL (ref 0.1–1.0)
Monocytes Relative: 9 % (ref 3–12)
NEUTROS ABS: 2.6 10*3/uL (ref 1.7–7.7)
Neutrophils Relative %: 50 % (ref 43–77)
PLATELETS: 225 10*3/uL (ref 150–400)
RBC: 4.57 MIL/uL (ref 3.87–5.11)
RDW: 14 % (ref 11.5–15.5)
WBC: 5.1 10*3/uL (ref 4.0–10.5)

## 2014-11-14 LAB — COMPREHENSIVE METABOLIC PANEL
ALK PHOS: 34 U/L — AB (ref 39–117)
ALT: 25 U/L (ref 0–35)
AST: 31 U/L (ref 0–37)
Albumin: 4.1 g/dL (ref 3.5–5.2)
BUN: 15 mg/dL (ref 6–23)
CO2: 28 mEq/L (ref 19–32)
Calcium: 9.1 mg/dL (ref 8.4–10.5)
Chloride: 102 mEq/L (ref 96–112)
Creat: 0.98 mg/dL (ref 0.50–1.10)
GLUCOSE: 69 mg/dL — AB (ref 70–99)
POTASSIUM: 4.2 meq/L (ref 3.5–5.3)
Sodium: 139 mEq/L (ref 135–145)
Total Bilirubin: 0.3 mg/dL (ref 0.2–1.2)
Total Protein: 6.5 g/dL (ref 6.0–8.3)

## 2014-11-14 MED ORDER — ESTROGENS CONJUGATED 0.9 MG PO TABS: 0.9000 mg | ORAL_TABLET | Freq: Every day | ORAL | Status: DC

## 2014-11-14 MED ORDER — MEDROXYPROGESTERONE ACETATE 10 MG PO TABS: 10.0000 mg | ORAL_TABLET | Freq: Every day | ORAL | Status: DC

## 2014-11-14 NOTE — Patient Instructions (Signed)
Schedule colonoscopy with  gastroenterology at [phone] or [facility] at [phone]  You may obtain a copy of any labs that were done today by logging onto MyChart as outlined in the instructions provided with your AVS (after visit summary). The office will not call with normal lab results but certainly if there are any significant abnormalities then we will contact you.   Health Maintenance, Female A healthy lifestyle and preventative care can promote health and wellness.  Maintain regular health, dental, and eye exams.  Eat a healthy diet. Foods like vegetables, fruits, whole grains, low-fat dairy products, and lean protein foods contain the nutrients you need without too many calories. Decrease your intake of foods high in solid fats, added sugars, and salt. Get information about a proper diet from your caregiver, if necessary.  Regular physical exercise is one of the most important things you can do for your health. Most adults should get at least 150 minutes of moderate-intensity exercise (any activity that increases your heart rate and causes you to sweat) each week. In addition, most adults need muscle-strengthening exercises on 2 or more days a week.   Maintain a healthy weight. The body mass index (BMI) is a screening tool to identify possible weight problems. It provides an estimate of body fat based on height and weight. Your caregiver can help determine your BMI, and can help you achieve or maintain a healthy weight. For adults 20 years and older:  A BMI below 18.5 is considered underweight.  A BMI of 18.5 to 24.9 is normal.  A BMI of 25 to 29.9 is considered overweight.  A BMI of 30 and above is considered obese.  Maintain normal blood lipids and cholesterol by exercising and minimizing your intake of saturated fat. Eat a balanced diet with plenty of fruits and vegetables. Blood tests for lipids and cholesterol should begin at age 20 and be repeated every  5 years. If your lipid or cholesterol levels are high, you are over 50, or you are a high risk for heart disease, you may need your cholesterol levels checked more frequently.Ongoing high lipid and cholesterol levels should be treated with medicines if diet and exercise are not effective.  If you smoke, find out from your caregiver how to quit. If you do not use tobacco, do not start.  Lung cancer screening is recommended for adults aged 55 80 years who are at high risk for developing lung cancer because of a history of smoking. Yearly low-dose computed tomography (CT) is recommended for people who have at least a 30-pack-year history of smoking and are a current smoker or have quit within the past 15 years. A pack year of smoking is smoking an average of 1 pack of cigarettes a day for 1 year (for example: 1 pack a day for 30 years or 2 packs a day for 15 years). Yearly screening should continue until the smoker has stopped smoking for at least 15 years. Yearly screening should also be stopped for people who develop a health problem that would prevent them from having lung cancer treatment.  If you are pregnant, do not drink alcohol. If you are breastfeeding, be very cautious about drinking alcohol. If you are not pregnant and choose to drink alcohol, do not exceed 1 drink per day. One drink is considered to be 12 ounces (355 mL) of beer, 5 ounces (148 mL) of wine, or 1.5 ounces (44 mL) of liquor.  Avoid use of street drugs. Do not share needles   with anyone. Ask for help if you need support or instructions about stopping the use of drugs.  High blood pressure causes heart disease and increases the risk of stroke. Blood pressure should be checked at least every 1 to 2 years. Ongoing high blood pressure should be treated with medicines, if weight loss and exercise are not effective.  If you are 55 to 53 years old, ask your caregiver if you should take aspirin to prevent strokes.  Diabetes screening  involves taking a blood sample to check your fasting blood sugar level. This should be done once every 3 years, after age 45, if you are within normal weight and without risk factors for diabetes. Testing should be considered at a younger age or be carried out more frequently if you are overweight and have at least 1 risk factor for diabetes.  Breast cancer screening is essential preventative care for women. You should practice "breast self-awareness." This means understanding the normal appearance and feel of your breasts and may include breast self-examination. Any changes detected, no matter how small, should be reported to a caregiver. Women in their 20s and 30s should have a clinical breast exam (CBE) by a caregiver as part of a regular health exam every 1 to 3 years. After age 40, women should have a CBE every year. Starting at age 40, women should consider having a mammogram (breast X-ray) every year. Women who have a family history of breast cancer should talk to their caregiver about genetic screening. Women at a high risk of breast cancer should talk to their caregiver about having an MRI and a mammogram every year.  Breast cancer gene (BRCA)-related cancer risk assessment is recommended for women who have family members with BRCA-related cancers. BRCA-related cancers include breast, ovarian, tubal, and peritoneal cancers. Having family members with these cancers may be associated with an increased risk for harmful changes (mutations) in the breast cancer genes BRCA1 and BRCA2. Results of the assessment will determine the need for genetic counseling and BRCA1 and BRCA2 testing.  The Pap test is a screening test for cervical cancer. Women should have a Pap test starting at age 21. Between ages 21 and 29, Pap tests should be repeated every 2 years. Beginning at age 30, you should have a Pap test every 3 years as long as the past 3 Pap tests have been normal. If you had a hysterectomy for a problem that  was not cancer or a condition that could lead to cancer, then you no longer need Pap tests. If you are between ages 65 and 70, and you have had normal Pap tests going back 10 years, you no longer need Pap tests. If you have had past treatment for cervical cancer or a condition that could lead to cancer, you need Pap tests and screening for cancer for at least 20 years after your treatment. If Pap tests have been discontinued, risk factors (such as a new sexual partner) need to be reassessed to determine if screening should be resumed. Some women have medical problems that increase the chance of getting cervical cancer. In these cases, your caregiver may recommend more frequent screening and Pap tests.  The human papillomavirus (HPV) test is an additional test that may be used for cervical cancer screening. The HPV test looks for the virus that can cause the cell changes on the cervix. The cells collected during the Pap test can be tested for HPV. The HPV test could be used to screen women aged   aged 60 years and older, and should be used in women of any age who have unclear Pap test results. After the age of 2, women should have HPV testing at the same frequency as a Pap test.  Colorectal cancer can be detected and often prevented. Most routine colorectal cancer screening begins at the age of 53 and continues through age 70. However, your caregiver may recommend screening at an earlier age if you have risk factors for colon cancer. On a yearly basis, your caregiver may provide home test kits to check for hidden blood in the stool. Use of a small camera at the end of a tube, to directly examine the colon (sigmoidoscopy or colonoscopy), can detect the earliest forms of colorectal cancer. Talk to your caregiver about this at age 51, when routine screening begins. Direct examination of the colon should be repeated every 5 to 10 years through age 29, unless early forms of pre-cancerous polyps or small growths  are found.  Hepatitis C blood testing is recommended for all people born from [birth year] through 1964 and any individual with known risks for hepatitis C.  Practice safe sex. Use condoms and avoid high-risk sexual practices to reduce the spread of sexually transmitted infections (STIs). Sexually active women aged 42 and younger should be checked for Chlamydia, which is a common sexually transmitted infection. Older women with new or multiple partners should also be tested for Chlamydia. Testing for other STIs is recommended if you are sexually active and at increased risk.  Osteoporosis is a disease in which the bones lose minerals and strength with aging. This can result in serious bone fractures. The risk of osteoporosis can be identified using a bone density scan. Women ages 44 and over and women at risk for fractures or osteoporosis should discuss screening with their caregivers. Ask your caregiver whether you should be taking a calcium supplement or vitamin D to reduce the rate of osteoporosis.  Menopause can be associated with physical symptoms and risks. Hormone replacement therapy is available to decrease symptoms and risks. You should talk to your caregiver about whether hormone replacement therapy is right for you.  Use sunscreen. Apply sunscreen liberally and repeatedly throughout the day. You should seek shade when your shadow is shorter than you. Protect yourself by wearing long sleeves, pants, a wide-brimmed hat, and sunglasses year round, whenever you are outdoors.  Notify your caregiver of new moles or changes in moles, especially if there is a change in shape or color. Also notify your caregiver if a mole is larger than the size of a pencil eraser.  Stay current with your immunizations. Document Released: 03/08/2011 Document Revised: 12/18/2012 Document Reviewed: 03/08/2011 [facility] Patient Information 2014 [place].

## 2014-11-14 NOTE — Addendum Note (Signed)
Addended by: Dayna BarkerGARDNER, Radiance Deady K on: 11/14/2014 12:43 PM   Modules accepted: Orders

## 2014-11-14 NOTE — Progress Notes (Signed)
Julie FleetVarita W Frederick 29-Jul-1961 [id number]        54 y.o.  G1P1003 for annual exam.  Several issues noted below.  Past medical history,surgical history, problem list, medications, allergies, family history and social history were all reviewed and documented as reviewed in the EPIC chart.  ROS:  Performed with pertinent positives and negatives included in the history, assessment and plan.   Additional significant findings :  none   Exam: [facility] personassistant Filed Vitals:   11/14/14 1158  BP: 126/80  Height: 5\' 1"  (1.549 m)  Weight: 155 lb (70.308 kg)   General appearance:  Normal affect, orientation and appearance. Skin: Grossly normal HEENT: Without gross lesions.  No cervical or supraclavicular adenopathy. Thyroid normal.  Lungs:  Clear without wheezing, rales or rhonchi Cardiac: RR, without RMG Abdominal:  Soft, nontender, without masses, guarding, rebound, organomegaly or hernia Breasts:  Examined lying and sitting without masses, retractions, discharge or axillary adenopathy. Pelvic:  Ext/BUS/vagina normal  Cervix normal. Pap/HPV  Uterus absent  Adnexa  Without masses or tenderness    Anus and perineum  Normal   Rectovaginal  Normal sphincter tone without palpated masses or tenderness.    Assessment/Plan:  54 y.o. 311P1003 female for annual exam.   1. Status post supracervical hysterectomy/BSO 1994 secondary to postpartum hemorrhage.  Continues on HRT to include Premarin 0.9 mg daily and Provera 10 mg for the first 13 days of each month. Has a little bit of staining at the end of her Provera. The issues of trying to wean now or continuing were reviewed. Stopping altogether or going to a lower dose discussed. Risks reviewed again to include increased risk of stroke, heart attack, DVT and breast cancer.  Patient's uncomfortable stopping at this point and wants to continue and does not want to "rock the boat". Refill times both provided. Call if any bleeding other than at the end of her  Provera. 2. Mammography 07/2014. Continue with annual mammography. SBE monthly reviewed.  3. Pap smear 12/2011. Pap smear done today.  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 Normal Pap smear since then. 4. DEXA 2009 normal. Will plan repeat further into the menopause. Check vitamin D level today. Patient is on HRT which is beneficial to her bones. 5. Colonoscopy never. I again recommended that she schedule a screening colonoscopy and she agrees to do so. Names and numbers provided. 6. Health maintenance. Baseline CBC, comprehensive metabolic panel, lipid profile, urinalysis ordered. Follow up in one year, sooner as needed. 1 year, sooner as needed.     Dara LordsFONTAINE,Kwamaine Cuppett P MD, 12:25 PM 11/14/2014

## 2014-11-15 LAB — URINALYSIS W MICROSCOPIC + REFLEX CULTURE
Bacteria, UA: NONE SEEN
Bilirubin Urine: NEGATIVE
CASTS: NONE SEEN
Crystals: NONE SEEN
Glucose, UA: NEGATIVE mg/dL
HGB URINE DIPSTICK: NEGATIVE
Ketones, ur: NEGATIVE mg/dL
NITRITE: NEGATIVE
PH: 6.5 (ref 5.0–8.0)
PROTEIN: NEGATIVE mg/dL
Specific Gravity, Urine: 1.005 (ref 1.005–1.030)
Squamous Epithelial / LPF: NONE SEEN
Urobilinogen, UA: 0.2 mg/dL (ref 0.0–1.0)

## 2014-11-15 LAB — VITAMIN D 25 HYDROXY (VIT D DEFICIENCY, FRACTURES): Vit D, 25-Hydroxy: 37 ng/mL (ref 30–100)

## 2014-11-16 LAB — URINE CULTURE: Colony Count: 60000

## 2014-11-19 LAB — CYTOLOGY - PAP

## 2014-12-13 ENCOUNTER — Other Ambulatory Visit: Payer: Self-pay | Admitting: Gynecology

## 2015-05-02 ENCOUNTER — Encounter: Payer: Self-pay | Admitting: Gynecology

## 2015-07-28 ENCOUNTER — Telehealth: Payer: Self-pay | Admitting: *Deleted

## 2015-07-28 MED ORDER — FLUCONAZOLE 150 MG PO TABS: 150.0000 mg | ORAL_TABLET | Freq: Every day | ORAL | Status: DC

## 2015-07-28 NOTE — Telephone Encounter (Signed)
Pt called c/o yeast infection itching and white discharge, was hoping Rx for Dilfican tablet could be sent? Please advise

## 2015-07-28 NOTE — Telephone Encounter (Signed)
Rx sent pt aware 

## 2015-07-28 NOTE — Telephone Encounter (Signed)
Okay for Diflucan 150 mg 1 dose 

## 2016-03-10 ENCOUNTER — Other Ambulatory Visit: Payer: Self-pay | Admitting: Gynecology

## 2016-03-19 ENCOUNTER — Ambulatory Visit (INDEPENDENT_AMBULATORY_CARE_PROVIDER_SITE_OTHER): Payer: BLUE CROSS/BLUE SHIELD | Admitting: Gynecology

## 2016-03-19 ENCOUNTER — Encounter: Payer: Self-pay | Admitting: Gynecology

## 2016-03-19 VITALS — BP 118/76

## 2016-03-19 DIAGNOSIS — N3000 Acute cystitis without hematuria: Secondary | ICD-10-CM

## 2016-03-19 DIAGNOSIS — R350 Frequency of micturition: Secondary | ICD-10-CM | POA: Diagnosis not present

## 2016-03-19 LAB — URINALYSIS W MICROSCOPIC + REFLEX CULTURE
BILIRUBIN URINE: NEGATIVE
CASTS: NONE SEEN [LPF]
CRYSTALS: NONE SEEN [HPF]
GLUCOSE, UA: NEGATIVE
HGB URINE DIPSTICK: NEGATIVE
Leukocytes, UA: NEGATIVE
Nitrite: NEGATIVE
Specific Gravity, Urine: 1.015 (ref 1.001–1.035)
Yeast: NONE SEEN [HPF]
pH: 7 (ref 5.0–8.0)

## 2016-03-19 MED ORDER — ESTROGENS CONJUGATED 0.9 MG PO TABS
0.9000 mg | ORAL_TABLET | Freq: Every day | ORAL | Status: DC
Start: 2016-03-19 — End: 2016-04-06

## 2016-03-19 MED ORDER — SULFAMETHOXAZOLE-TRIMETHOPRIM 800-160 MG PO TABS: 1.0000 | ORAL_TABLET | Freq: Two times a day (BID) | ORAL | Status: DC

## 2016-03-19 NOTE — Patient Instructions (Signed)
Take the antibiotic twice daily for 3 days. Follow up if symptoms persist, worsen or recur. 

## 2016-03-19 NOTE — Progress Notes (Signed)
    Julie Frederick 1961-03-15 [id number]        55 y.o.  G1P1003 presents with several month history of urinary frequency. Has been out of town and unable to be seen. Denies dysuria, urgency, low back pain, fever or chills. No vaginal discharge, odor, irritation.  Past medical history,surgical history, problem list, medications, allergies, family history and social history were all reviewed and documented in the EPIC chart.  Directed ROS with pertinent positives and negatives documented in the history of present illness/assessment and plan.  Exam: Filed Vitals:   03/19/16 1414  BP: 118/76   General appearance:  Normal Spine straight without CVA tenderness Abdomen soft nontender without masses guarding rebound  Assessment/Plan:  55 y.o. G1P1003 with urinary frequency. Urinalysis showed some bacteria but otherwise unremarkable. Will culture for completeness. Will cover with Septra DS 1 by mouth twice a day 3 days. Follow up if symptoms persist, worsen or recur. Patient does have her annual exam scheduled beginning of next month. I refilled her Prem[facility] to [facility] with no refill    Dara LordsFONTAINE,TIMOTHY P MD, 2:31 PM 03/19/2016

## 2016-03-21 LAB — URINE CULTURE

## 2016-03-22 ENCOUNTER — Other Ambulatory Visit: Payer: Self-pay | Admitting: Gynecology

## 2016-03-22 NOTE — Telephone Encounter (Signed)
Annual scheduled 04/06/16

## 2016-04-06 ENCOUNTER — Encounter: Payer: Self-pay | Admitting: Gynecology

## 2016-04-06 ENCOUNTER — Ambulatory Visit (INDEPENDENT_AMBULATORY_CARE_PROVIDER_SITE_OTHER): Payer: BLUE CROSS/BLUE SHIELD | Admitting: Gynecology

## 2016-04-06 VITALS — BP 120/76 | Ht 61.0 in | Wt 155.0 lb

## 2016-04-06 DIAGNOSIS — Z1321 Encounter for screening for nutritional disorder: Secondary | ICD-10-CM

## 2016-04-06 DIAGNOSIS — Z01419 Encounter for gynecological examination (general) (routine) without abnormal findings: Secondary | ICD-10-CM

## 2016-04-06 DIAGNOSIS — Z1329 Encounter for screening for other suspected endocrine disorder: Secondary | ICD-10-CM | POA: Diagnosis not present

## 2016-04-06 DIAGNOSIS — Z13220 Encounter for screening for lipoid disorders: Secondary | ICD-10-CM

## 2016-04-06 DIAGNOSIS — Z79890 Hormone replacement therapy (postmenopausal): Secondary | ICD-10-CM | POA: Diagnosis not present

## 2016-04-06 LAB — CBC WITH DIFFERENTIAL/PLATELET
BASOS PCT: 1 %
Basophils Absolute: 46 cells/uL (ref 0–200)
EOS ABS: 138 {cells}/uL (ref 15–500)
Eosinophils Relative: 3 %
HEMATOCRIT: 38.8 % (ref 35.0–45.0)
HEMOGLOBIN: 12.8 g/dL (ref 11.7–15.5)
LYMPHS PCT: 51 %
Lymphs Abs: 2346 cells/uL (ref 850–3900)
MCH: 28 pg (ref 27.0–33.0)
MCHC: 33 g/dL (ref 32.0–36.0)
MCV: 84.9 fL (ref 80.0–100.0)
MONO ABS: 368 {cells}/uL (ref 200–950)
MPV: 9.8 fL (ref 7.5–12.5)
Monocytes Relative: 8 %
NEUTROS ABS: 1702 {cells}/uL (ref 1500–7800)
Neutrophils Relative %: 37 %
Platelets: 239 10*3/uL (ref 140–400)
RBC: 4.57 MIL/uL (ref 3.80–5.10)
RDW: 14 % (ref 11.0–15.0)
WBC: 4.6 10*3/uL (ref 3.8–10.8)

## 2016-04-06 LAB — LIPID PANEL
CHOL/HDL RATIO: 2.4 ratio (ref <=5.0)
CHOLESTEROL: 151 mg/dL (ref 125–200)
HDL: 62 mg/dL (ref >=46)
LDL Cholesterol: 64 mg/dL (ref <130)
Triglycerides: 123 mg/dL (ref <150)
VLDL: 25 mg/dL (ref <30)

## 2016-04-06 LAB — COMPREHENSIVE METABOLIC PANEL
ALK PHOS: 34 U/L (ref 33–130)
ALT: 23 U/L (ref 6–29)
AST: 24 U/L (ref 10–35)
Albumin: 4 g/dL (ref 3.6–5.1)
BUN: 14 mg/dL (ref 7–25)
CALCIUM: 9.1 mg/dL (ref 8.6–10.4)
CO2: 25 mmol/L (ref 20–31)
Chloride: 102 mmol/L (ref 98–110)
Creat: 0.94 mg/dL (ref 0.50–1.05)
Glucose, Bld: 75 mg/dL (ref 65–99)
POTASSIUM: 4.1 mmol/L (ref 3.5–5.3)
Sodium: 138 mmol/L (ref 135–146)
TOTAL PROTEIN: 6.8 g/dL (ref 6.1–8.1)
Total Bilirubin: 0.4 mg/dL (ref 0.2–1.2)

## 2016-04-06 LAB — TSH: TSH: 2.07 mIU/L

## 2016-04-06 MED ORDER — ESTROGENS CONJUGATED 0.9 MG PO TABS: 0.9000 mg | ORAL_TABLET | Freq: Every day | ORAL | 4 refills | Status: DC

## 2016-04-06 MED ORDER — MEDROXYPROGESTERONE ACETATE 10 MG PO TABS: ORAL_TABLET | ORAL | 4 refills | Status: DC

## 2016-04-06 NOTE — Patient Instructions (Signed)
Schedule your mammogram  You may obtain a copy of any labs that were done today by logging onto MyChart as outlined in the instructions provided with your AVS (after visit summary). The office will not call with normal lab results but certainly if there are any significant abnormalities then we will contact you.   Health Maintenance Adopting a healthy lifestyle and getting preventive care can go a long way to promote health and wellness. Talk with your health care provider about what schedule of regular examinations is right for you. This is a good chance for you to check in with your provider about disease prevention and staying healthy. In between checkups, there are plenty of things you can do on your own. Experts have done a lot of research about which lifestyle changes and preventive measures are most likely to keep you healthy. Ask your health care provider for more information. WEIGHT AND DIET  Eat a healthy diet  Be sure to include plenty of vegetables, fruits, low-fat dairy products, and lean protein.  Do not eat a lot of foods high in solid fats, added sugars, or salt.  Get regular exercise. This is one of the most important things you can do for your health.  Most adults should exercise for at least 150 minutes each week. The exercise should increase your heart rate and make you sweat (moderate-intensity exercise).  Most adults should also do strengthening exercises at least twice a week. This is in addition to the moderate-intensity exercise.  Maintain a healthy weight  Body mass index (BMI) is a measurement that can be used to identify possible weight problems. It estimates body fat based on height and weight. Your health care provider can help determine your BMI and help you achieve or maintain a healthy weight.  For females 20 years of age and older:   A BMI below 18.5 is considered underweight.  A BMI of 18.5 to 24.9 is normal.  A BMI of 25 to 29.9 is considered  overweight.  A BMI of 30 and above is considered obese.  Watch levels of cholesterol and blood lipids  You should start having your blood tested for lipids and cholesterol at 54 years of age, then have this test every 5 years.  You may need to have your cholesterol levels checked more often if:  Your lipid or cholesterol levels are high.  You are older than 54 years of age.  You are at high risk for heart disease.  CANCER SCREENING   Lung Cancer  Lung cancer screening is recommended for adults 55-80 years old who are at high risk for lung cancer because of a history of smoking.  A yearly low-dose CT scan of the lungs is recommended for people who:  Currently smoke.  Have quit within the past 15 years.  Have at least a 30-pack-year history of smoking. A pack year is smoking an average of one pack of cigarettes a day for 1 year.  Yearly screening should continue until it has been 15 years since you quit.  Yearly screening should stop if you develop a health problem that would prevent you from having lung cancer treatment.  Breast Cancer  Practice breast self-awareness. This means understanding how your breasts normally appear and feel.  It also means doing regular breast self-exams. Let your health care provider know about any changes, no matter how small.  If you are in your 20s or 30s, you should have a clinical breast exam (CBE) by a health   care provider every 1-3 years as part of a regular health exam.  If you are 76 or older, have a CBE every year. Also consider having a breast X-ray (mammogram) every year.  If you have a family history of breast cancer, talk to your health care provider about genetic screening.  If you are at high risk for breast cancer, talk to your health care provider about having an MRI and a mammogram every year.  Breast cancer gene (BRCA) assessment is recommended for women who have family members with BRCA-related cancers. BRCA-related  cancers include:  Breast.  Ovarian.  Tubal.  Peritoneal cancers.  Results of the assessment will determine the need for genetic counseling and BRCA1 and BRCA2 testing. Cervical Cancer Routine pelvic examinations to screen for cervical cancer are no longer recommended for nonpregnant women who are considered low risk for cancer of the pelvic organs (ovaries, uterus, and vagina) and who do not have symptoms. A pelvic examination may be necessary if you have symptoms including those associated with pelvic infections. Ask your health care provider if a screening pelvic exam is right for you.   The Pap test is the screening test for cervical cancer for women who are considered at risk.  If you had a hysterectomy for a problem that was not cancer or a condition that could lead to cancer, then you no longer need Pap tests.  If you are older than 65 years, and you have had normal Pap tests for the past 10 years, you no longer need to have Pap tests.  If you have had past treatment for cervical cancer or a condition that could lead to cancer, you need Pap tests and screening for cancer for at least 20 years after your treatment.  If you no longer get a Pap test, assess your risk factors if they change (such as having a new sexual partner). This can affect whether you should start being screened again.  Some women have medical problems that increase their chance of getting cervical cancer. If this is the case for you, your health care provider may recommend more frequent screening and Pap tests.  The human papillomavirus (HPV) test is another test that may be used for cervical cancer screening. The HPV test looks for the virus that can cause cell changes in the cervix. The cells collected during the Pap test can be tested for HPV.  The HPV test can be used to screen women 64 years of age and older. Getting tested for HPV can extend the interval between normal Pap tests from three to five  years.  An HPV test also should be used to screen women of any age who have unclear Pap test results.  After 54 years of age, women should have HPV testing as often as Pap tests.  Colorectal Cancer  This type of cancer can be detected and often prevented.  Routine colorectal cancer screening usually begins at 54 years of age and continues through 54 years of age.  Your health care provider may recommend screening at an earlier age if you have risk factors for colon cancer.  Your health care provider may also recommend using home test kits to check for hidden blood in the stool.  A small camera at the end of a tube can be used to examine your colon directly (sigmoidoscopy or colonoscopy). This is done to check for the earliest forms of colorectal cancer.  Routine screening usually begins at age 14.  Direct examination of the  colon should be repeated every 5-10 years through 54 years of age. However, you may need to be screened more often if early forms of precancerous polyps or small growths are found. Skin Cancer  Check your skin from head to toe regularly.  Tell your health care provider about any new moles or changes in moles, especially if there is a change in a mole's shape or color.  Also tell your health care provider if you have a mole that is larger than the size of a pencil eraser.  Always use sunscreen. Apply sunscreen liberally and repeatedly throughout the day.  Protect yourself by wearing long sleeves, pants, a wide-brimmed hat, and sunglasses whenever you are outside. HEART DISEASE, DIABETES, AND HIGH BLOOD PRESSURE   Have your blood pressure checked at least every 1-2 years. High blood pressure causes heart disease and increases the risk of stroke.  If you are between 57 years and 49 years old, ask your health care provider if you should take aspirin to prevent strokes.  Have regular diabetes screenings. This involves taking a blood sample to check your fasting  blood sugar level.  If you are at a normal weight and have a low risk for diabetes, have this test once every three years after 54 years of age.  If you are overweight and have a high risk for diabetes, consider being tested at a younger age or more often. PREVENTING INFECTION  Hepatitis B  If you have a higher risk for hepatitis B, you should be screened for this virus. You are considered at high risk for hepatitis B if:  You were born in a country where hepatitis B is common. Ask your health care provider which countries are considered high risk.  Your parents were born in a high-risk country, and you have not been immunized against hepatitis B (hepatitis B vaccine).  You have HIV or AIDS.  You use needles to inject street drugs.  You live with someone who has hepatitis B.  You have had sex with someone who has hepatitis B.  You get hemodialysis treatment.  You take certain medicines for conditions, including cancer, organ transplantation, and autoimmune conditions. Hepatitis C  Blood testing is recommended for:  Everyone born from [birth year] through 1964.  Anyone with known risk factors for hepatitis C. Sexually transmitted infections (STIs)  You should be screened for sexually transmitted infections (STIs) including gonorrhea and chlamydia if:  You are sexually active and are younger than 54 years of age.  You are older than 54 years of age and your health care provider tells you that you are at risk for this type of infection.  Your sexual activity has changed since you were last screened and you are at an increased risk for chlamydia or gonorrhea. Ask your health care provider if you are at risk.  If you do not have HIV, but are at risk, it may be recommended that you take a prescription medicine daily to prevent HIV infection. This is called pre-exposure prophylaxis (PrEP). You are considered at risk if:  You are sexually active and do not regularly use condoms or know  the HIV status of your partner(s).  You take drugs by injection.  You are sexually active with a partner who has HIV. Talk with your health care provider about whether you are at high risk of being infected with HIV. If you choose to begin PrEP, you should first be tested for HIV. You should then be tested every 3 months  for as long as you are taking PrEP.  PREGNANCY   If you are premenopausal and you may become pregnant, ask your health care provider about preconception counseling.  If you may become pregnant, take 400 to 800 micrograms (mcg) of folic acid every day.  If you want to prevent pregnancy, talk to your health care provider about birth control (contraception). OSTEOPOROSIS AND MENOPAUSE   Osteoporosis is a disease in which the bones lose minerals and strength with aging. This can result in serious bone fractures. Your risk for osteoporosis can be identified using a bone density scan.  If you are 65 years of age or older, or if you are at risk for osteoporosis and fractures, ask your health care provider if you should be screened.  Ask your health care provider whether you should take a calcium or vitamin D supplement to lower your risk for osteoporosis.  Menopause may have certain physical symptoms and risks.  Hormone replacement therapy may reduce some of these symptoms and risks. Talk to your health care provider about whether hormone replacement therapy is right for you.  HOME CARE INSTRUCTIONS   Schedule regular health, dental, and eye exams.  Stay current with your immunizations.   Do not use any tobacco products including cigarettes, chewing tobacco, or electronic cigarettes.  If you are pregnant, do not drink alcohol.  If you are breastfeeding, limit how much and how often you drink alcohol.  Limit alcohol intake to no more than 1 drink per day for nonpregnant women. One drink equals 12 ounces of beer, 5 ounces of wine, or 1 ounces of hard liquor.  Do not  use street drugs.  Do not share needles.  Ask your health care provider for help if you need support or information about quitting drugs.  Tell your health care provider if you often feel depressed.  Tell your health care provider if you have ever been abused or do not feel safe at home. Document Released: 03/08/2011 Document Revised: 01/07/2014 Document Reviewed: 07/25/2013 ExitCare Patient Information 2015 [employer]. This information is not intended to replace advice given to you by your health care provider. Make sure you discuss any questions you have with your health care provider.  

## 2016-04-06 NOTE — Progress Notes (Signed)
    Julie Frederick [birth year] [id number]        55 y.o.  G1P1003  for annual exam.  Several issues noted below.  Past medical history,surgical history, problem list, medications, allergies, family history and social history were all reviewed and documented as reviewed in the EPIC chart.  ROS:  Performed with pertinent positives and negatives included in the history, assessment and plan.   Additional significant findings :  None   Exam: Kennon Portela assistant Vitals:   04/06/16 1233  BP: 120/76  Weight: 155 lb (70.3 kg)  Height: 5\' 1"  (1.549 m)   General appearance:  Normal affect, orientation and appearance. Skin: Grossly normal HEENT: Without gross lesions.  No cervical or supraclavicular adenopathy. Thyroid normal.  Lungs:  Clear without wheezing, rales or rhonchi Cardiac: RR, without RMG Abdominal:  Soft, nontender, without masses, guarding, rebound, organomegaly or hernia Breasts:  Examined lying and sitting without masses, retractions, discharge or axillary adenopathy. Pelvic:  Ext/BUS/Vagina Normal  Cervix normal  Uterus absent  Adnexa without masses or tenderness    Anus and perineum normal   Rectovaginal normal sphincter tone without palpated masses or tenderness.    Assessment/Plan:  55 y.o. G23P1003 female for annual exam.   1. Postmenopausal/HRT. Status post supracervical hysterectomy BSO for postpartum hemorrhage. Currently on Premarin 0.9 mg daily and Provera 10 mg the first 13 days of each month. Has a little staining at the end of her progesterone. I reviewed with the patient most current 2017 names HRT guidelines. Possible benefits from starting early to include symptom relief, cardiovascular support and bone health versus risks to include thrombosis such as stroke heart attack DVT and breast cancer. Patient strongly wants to continue. I reviewed various regimens to include transdermal and naturalized progesterone. The patient is not interested in changing  anything at this point as she does not want to rock the boat and she feels very good. Refill both 1 year. Report any bleeding that does not occur and at the progesterone. 2. Mammography overdue. Patient agrees to call and schedule. SBE monthly reviewed. 3. Pap smear 2016. No Pap smear done today. History of dysplasia in her 33s with normal Pap smears since then. Plan repeat Pap smear at 3 year interval. 4. Colonoscopy 2016. Repeat at their recommended interval. 5. DEXA 2009 normal. Plan repeat further into the menopause. Check vitamin D level today. 6. Health maintenance. Patient requests a sliding labs. Is in the process of setting up an appointment to see a primary physician. CBC, CMP, lipid profile, urinalysis, TSH, vitamin D ordered. Follow up in one year, sooner as needed.   Dara Lords MD, 12:54 PM 04/06/2016

## 2016-04-07 LAB — URINALYSIS W MICROSCOPIC + REFLEX CULTURE
Bacteria, UA: NONE SEEN [HPF]
Bilirubin Urine: NEGATIVE
Casts: NONE SEEN [LPF]
Crystals: NONE SEEN [HPF]
GLUCOSE, UA: NEGATIVE
Hgb urine dipstick: NEGATIVE
Ketones, ur: NEGATIVE
LEUKOCYTES UA: NEGATIVE
NITRITE: NEGATIVE
PH: 7 (ref 5.0–8.0)
Protein, ur: NEGATIVE
SPECIFIC GRAVITY, URINE: 1.014 (ref 1.001–1.035)
WBC, UA: NONE SEEN WBC/HPF (ref <=5)
YEAST: NONE SEEN [HPF]

## 2016-04-07 LAB — VITAMIN D 25 HYDROXY (VIT D DEFICIENCY, FRACTURES): VIT D 25 HYDROXY: 79 ng/mL (ref 30–100)

## 2016-04-08 ENCOUNTER — Other Ambulatory Visit: Payer: Self-pay | Admitting: Gynecology

## 2016-04-08 LAB — URINE CULTURE

## 2016-04-08 MED ORDER — NITROFURANTOIN MONOHYD MACRO 100 MG PO CAPS: 100.0000 mg | ORAL_CAPSULE | Freq: Two times a day (BID) | ORAL | 0 refills | Status: DC

## 2016-06-16 ENCOUNTER — Encounter: Payer: Self-pay | Admitting: Gynecology

## 2017-01-19 ENCOUNTER — Encounter: Payer: Self-pay | Admitting: Gynecology

## 2017-04-18 ENCOUNTER — Encounter: Payer: BLUE CROSS/BLUE SHIELD | Admitting: Gynecology

## 2017-04-18 DIAGNOSIS — Z0289 Encounter for other administrative examinations: Secondary | ICD-10-CM

## 2017-05-20 ENCOUNTER — Other Ambulatory Visit: Payer: Self-pay | Admitting: Gynecology

## 2017-05-23 ENCOUNTER — Ambulatory Visit (INDEPENDENT_AMBULATORY_CARE_PROVIDER_SITE_OTHER): Payer: BLUE CROSS/BLUE SHIELD | Admitting: Gynecology

## 2017-05-23 ENCOUNTER — Encounter: Payer: Self-pay | Admitting: Gynecology

## 2017-05-23 VITALS — BP 130/80 | Ht 61.0 in | Wt 155.0 lb

## 2017-05-23 DIAGNOSIS — Z79890 Hormone replacement therapy (postmenopausal): Secondary | ICD-10-CM

## 2017-05-23 DIAGNOSIS — N952 Postmenopausal atrophic vaginitis: Secondary | ICD-10-CM | POA: Diagnosis not present

## 2017-05-23 DIAGNOSIS — Z01411 Encounter for gynecological examination (general) (routine) with abnormal findings: Secondary | ICD-10-CM | POA: Diagnosis not present

## 2017-05-23 MED ORDER — ESTROGENS CONJUGATED 0.9 MG PO TABS: 0.9000 mg | ORAL_TABLET | Freq: Every day | ORAL | 4 refills | Status: DC

## 2017-05-23 MED ORDER — AMLODIPINE BESYLATE 10 MG PO TABS: 10.0000 mg | ORAL_TABLET | Freq: Every day | ORAL | 4 refills | Status: AC

## 2017-05-23 MED ORDER — MEDROXYPROGESTERONE ACETATE 10 MG PO TABS: ORAL_TABLET | ORAL | 4 refills | Status: DC

## 2017-05-23 NOTE — Telephone Encounter (Signed)
Was in the office for appointment when I reviewed this.

## 2017-05-23 NOTE — Progress Notes (Signed)
    Julie Frederick 1961-04-10 [id number]        56 y.o.  G1P1003 for annual gynecologic exam.  Doing well without complaints.  Past medical history,surgical history, problem list, medications, allergies, family history and social history were all reviewed and documented as reviewed in the EPIC chart.  ROS:  Performed with pertinent positives and negatives included in the history, assessment and plan.   Additional significant findings :  None   Exam: Kennon Portela assistant Vitals:   05/23/17 1228  BP: 130/80  Weight: 155 lb (70.3 kg)  Height:  (1.549 m)   Body mass index is 29.29 kg/m.  General appearance:  Normal affect, orientation and appearance. Skin: Grossly normal HEENT: Without gross lesions.  No cervical or supraclavicular adenopathy. Thyroid normal.  Lungs:  Clear without wheezing, rales or rhonchi Cardiac: RR, without RMG Abdominal:  Soft, nontender, without masses, guarding, rebound, organomegaly or hernia Breasts:  Examined lying and sitting without masses, retractions, discharge or axillary adenopathy. Pelvic:  Ext, BUS, Vagina: With atrophic changes  Cervix: With atrophic changes. Pap smear done  Uterus: Absent consistent with history of supracervical hysterectomy   Adnexa: Without masses or tenderness    Anus and perineum: Normal   Rectovaginal: Normal sphincter tone without palpated masses or tenderness.    Assessment/Plan:  56 y.o. G33P1003 female for annual gynecologic exam.   1. Postmenopausal/HRT. Status post supracervical hysterectomy BSO for postpartum hemorrhage. On Premarin 0.9 mg and Provera 10 mg first 13 days of each month with slight spotting afterwards. Has done so for years. Reviewed current guidelines. Benefits as far as symptom relief, cardiovascular and bone health when started early as in her case versus risks to include stroke heart attack DVT and possible breast cancer all reviewed. Alternatives to use Provera daily and avoid any  staining discussed. Patient's comfortable taking the way she is and does not want to change her regimen. She understands and accepts risks. Refill 1 year provided. 2. Mammography coming due in October and she knows to schedule this. Breast exam normal today. 3. Pap smear 2016. Pap smear done today. History of dysplasia in her 55s with normal Pap smears since. 4. DEXA 2009 normal. Plan repeat at age 56. 5. Colonoscopy 2016. Repeat at their recommended interval. 6. Health maintenance. Patient has routine lab work done at her new primary physician's office.  Was given a prescription for Norvasc there but asked if I could write the prescription through her mail order to save her money. I went ahead and did so but she understands that they are managing the medication. Follow up in one year, sooner as needed.   Dara Lords MD, 12:55 PM 05/23/2017

## 2017-05-23 NOTE — Patient Instructions (Signed)
Follow up in one year, sooner as needed. 

## 2017-05-23 NOTE — Addendum Note (Signed)
Addended by: Dayna Barker on: 05/23/2017 01:04 PM   Modules accepted: Orders

## 2017-05-24 LAB — PAP IG W/ RFLX HPV ASCU

## 2018-05-19 ENCOUNTER — Telehealth: Payer: Self-pay | Admitting: *Deleted

## 2018-05-19 MED ORDER — FLUCONAZOLE 150 MG PO TABS: 150.0000 mg | ORAL_TABLET | Freq: Once | ORAL | 0 refills | Status: AC

## 2018-05-19 NOTE — Telephone Encounter (Signed)
Diflucan 150mg x 1 dose

## 2018-05-19 NOTE — Telephone Encounter (Signed)
Patient informed, Rx sent.  

## 2018-05-19 NOTE — Telephone Encounter (Signed)
Patient called c/o vaginal itching and white discharge,asked if Rx could be sent to pharmacy? Please advise

## 2018-05-26 ENCOUNTER — Encounter: Payer: BLUE CROSS/BLUE SHIELD | Admitting: Gynecology

## 2018-06-02 ENCOUNTER — Encounter: Payer: BLUE CROSS/BLUE SHIELD | Admitting: Gynecology

## 2018-06-12 ENCOUNTER — Ambulatory Visit: Payer: BLUE CROSS/BLUE SHIELD | Admitting: Women's Health

## 2018-06-12 ENCOUNTER — Encounter: Payer: Self-pay | Admitting: Women's Health

## 2018-06-12 VITALS — BP 122/80

## 2018-06-12 DIAGNOSIS — N898 Other specified noninflammatory disorders of vagina: Secondary | ICD-10-CM

## 2018-06-12 LAB — WET PREP FOR TRICH, YEAST, CLUE

## 2018-06-12 NOTE — Progress Notes (Signed)
57 year old W BF G1, P3 presents with request to check if yeast infection is cleared.  Was treated on 05/19/2018 with Diflucan 151 dose after antibiotic for upper respiratory infection.  Denies vaginal itching, burning, discharge or odor.  Not sexually active.  TVH supracervical with BSO on HRT.  Denies urinary symptoms, abdominal pain or fever.  Tearful discussing upcoming son's wedding this weekend since husband deceased.  Primary care manages hypertension.  Exam: Appears well.  External genitalia within normal limits, speculum exam scant discharge without odor or erythema noted, wet prep negative.  Resolved yeast  Plan: Reviewed normality of exam and wet prep.  Condolences given regarding husband.  Keep scheduled annual exam with Dr. Audie Box.

## 2018-07-17 ENCOUNTER — Encounter: Payer: Self-pay | Admitting: Gynecology

## 2018-07-17 ENCOUNTER — Ambulatory Visit: Payer: BLUE CROSS/BLUE SHIELD | Admitting: Gynecology

## 2018-07-17 VITALS — BP 120/80 | Ht 61.0 in | Wt 146.0 lb

## 2018-07-17 DIAGNOSIS — Z79890 Hormone replacement therapy (postmenopausal): Secondary | ICD-10-CM | POA: Diagnosis not present

## 2018-07-17 DIAGNOSIS — Z01419 Encounter for gynecological examination (general) (routine) without abnormal findings: Secondary | ICD-10-CM

## 2018-07-17 MED ORDER — MEDROXYPROGESTERONE ACETATE 10 MG PO TABS: ORAL_TABLET | ORAL | 4 refills | Status: DC

## 2018-07-17 MED ORDER — ESTROGENS CONJUGATED 0.9 MG PO TABS: 0.9000 mg | ORAL_TABLET | Freq: Every day | ORAL | 4 refills | Status: DC

## 2018-07-17 NOTE — Patient Instructions (Signed)
Call to Schedule your mammogram  Facilities in [place]: 1)  [facility]. [facility], [address] Phone: [phone] 2)  Dr. Bertrand at [facility]  [address] Phone: [phone]     Mammogram A mammogram is an X-ray test to find changes in a woman's breast. You should get a mammogram if:  You are 56 years of age or older  You have risk factors.   Your doctor recommends that you have one.  BEFORE THE TEST  Do not schedule the test the week before your period, especially if your breasts are sore during this time.  On the day of your mammogram:  Wash your breasts and armpits well. After washing, do not put on any deodorant or talcum powder on until after your test.   Eat and drink as you usually do.   Take your medicines as usual.   If you are diabetic and take insulin, make sure you:   Eat before coming for your test.   Take your insulin as usual.   If you cannot keep your appointment, call before the appointment to cancel. Schedule another appointment.  TEST  You will need to undress from the waist up. You will put on a hospital gown.   Your breast will be put on the mammogram machine, and it will press firmly on your breast with a piece of plastic called a compression paddle. This will make your breast flatter so that the machine can X-ray all parts of your breast.   Both breasts will be X-rayed. Each breast will be X-rayed from above and from the side. An X-ray might need to be taken again if the picture is not good enough.   The mammogram will last about 15 to 30 minutes.  AFTER THE TEST Finding out the results of your test Ask when your test results will be ready. Make sure you get your test results.  Document Released: 11/19/2008 Document Revised: 08/12/2011 Document Reviewed: 11/19/2008 ExitCare Patient Information 2012 [employer].   

## 2018-07-17 NOTE — Progress Notes (Signed)
    Julie Frederick July 18, 1961 [id number]        57 y.o.  G1P1003 for annual gynecologic exam.  Without gynecologic complaints  Past medical history,surgical history, problem list, medications, allergies, family history and social history were all reviewed and documented as reviewed in the EPIC chart.  ROS:  Performed with pertinent positives and negatives included in the history, assessment and plan.   Additional significant findings : None   Exam: Kennon Portela assistant Vitals:   07/17/18 1157  BP: 120/80  Weight: 146 lb (66.2 kg)  Height: 5\' 1"  (1.549 m)   Body mass index is 27.59 kg/m.  General appearance:  Normal affect, orientation and appearance. Skin: Grossly normal HEENT: Without gross lesions.  No cervical or supraclavicular adenopathy. Thyroid normal.  Lungs:  Clear without wheezing, rales or rhonchi Cardiac: RR, without RMG Abdominal:  Soft, nontender, without masses, guarding, rebound, organomegaly or hernia Breasts:  Examined lying and sitting without masses, retractions, discharge or axillary adenopathy. Pelvic:  Ext, BUS, Vagina: Normal with mild atrophic changes  Cervix: Normal  Uterus: Absent consistent with history of supracervical hysterectomy adnexa: Without masses or tenderness    Anus and perineum: Normal   Rectovaginal: Normal sphincter tone without palpated masses or tenderness.    Assessment/Plan:  57 y.o. G57P1003 female for annual gynecologic exam.   1. Postmenopausal/HRT.  Continues on Premarin 0.9 mg and Provera 10 mg for 13 days each month with spotting.  No bleeding otherwise.  Status post supracervical hysterectomy in the past.  We again reviewed the risks versus benefits of HRT.  Increased risk of thrombosis in the breast cancer issue versus benefits of symptom relief and possible cardiovascular and bone health all reviewed.  At this point patient wants to continue.  I refilled her x1 year. 2. Mammography due now and I reminded patient to  schedule.  Names and numbers provided.  Breast exam normal today. 3. Pap smear 2018.  No Pap smear done today.  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 4. DEXA 2009 normal.  Plan repeat DEXA at age 76. 5. Colonoscopy 2016.  Repeat at their recommended interval. 6. Health maintenance.  No routine lab work done as patient reports this done elsewhere.  Follow-up 1 year, sooner as needed.   Dara Lords MD, 12:16 PM 07/17/2018

## 2018-07-25 ENCOUNTER — Encounter: Payer: BLUE CROSS/BLUE SHIELD | Admitting: Gynecology

## 2018-07-27 ENCOUNTER — Encounter: Payer: BLUE CROSS/BLUE SHIELD | Admitting: Gynecology

## 2018-07-30 ENCOUNTER — Other Ambulatory Visit: Payer: Self-pay | Admitting: Gynecology

## 2018-07-31 ENCOUNTER — Encounter: Payer: BLUE CROSS/BLUE SHIELD | Admitting: Gynecology

## 2018-08-23 ENCOUNTER — Ambulatory Visit
Admission: RE | Admit: 2018-08-23 | Discharge: 2018-08-23 | Disposition: A | Discharge status: Home / Self Care | Payer: BLUE CROSS/BLUE SHIELD | Source: Ambulatory Visit | Attending: Gynecology | Admitting: Gynecology

## 2018-08-23 ENCOUNTER — Other Ambulatory Visit: Payer: Self-pay | Admitting: Gynecology

## 2018-08-23 DIAGNOSIS — Z1231 Encounter for screening mammogram for malignant neoplasm of breast: Secondary | ICD-10-CM

## 2019-06-05 ENCOUNTER — Encounter: Payer: Self-pay | Admitting: Gynecology

## 2019-10-05 ENCOUNTER — Other Ambulatory Visit: Payer: Self-pay

## 2019-10-09 ENCOUNTER — Other Ambulatory Visit: Payer: Self-pay | Admitting: *Deleted

## 2019-10-15 ENCOUNTER — Other Ambulatory Visit: Payer: Self-pay | Admitting: Physician Assistant

## 2019-10-15 DIAGNOSIS — Z1231 Encounter for screening mammogram for malignant neoplasm of breast: Secondary | ICD-10-CM

## 2019-11-12 ENCOUNTER — Other Ambulatory Visit: Payer: Self-pay | Admitting: Physician Assistant

## 2019-11-12 DIAGNOSIS — N644 Mastodynia: Secondary | ICD-10-CM

## 2019-11-23 ENCOUNTER — Ambulatory Visit: Payer: BLUE CROSS/BLUE SHIELD

## 2019-11-23 ENCOUNTER — Ambulatory Visit
Admission: RE | Admit: 2019-11-23 | Discharge: 2019-11-23 | Disposition: A | Discharge status: Home / Self Care | Payer: BLUE CROSS/BLUE SHIELD | Source: Ambulatory Visit | Attending: Physician Assistant | Admitting: Physician Assistant

## 2019-11-23 ENCOUNTER — Ambulatory Visit
Admission: RE | Admit: 2019-11-23 | Discharge: 2019-11-23 | Disposition: A | Discharge status: Home / Self Care | Payer: BC Managed Care – PPO | Source: Ambulatory Visit | Attending: Physician Assistant | Admitting: Physician Assistant

## 2019-11-23 ENCOUNTER — Other Ambulatory Visit: Payer: Self-pay | Admitting: Physician Assistant

## 2019-11-23 ENCOUNTER — Other Ambulatory Visit: Payer: Self-pay

## 2019-11-23 DIAGNOSIS — N644 Mastodynia: Secondary | ICD-10-CM

## 2019-11-23 DIAGNOSIS — N6320 Unspecified lump in the left breast, unspecified quadrant: Secondary | ICD-10-CM

## 2020-01-04 ENCOUNTER — Encounter: Payer: Self-pay | Admitting: Obstetrics and Gynecology

## 2020-01-09 ENCOUNTER — Other Ambulatory Visit: Payer: Self-pay

## 2020-01-11 ENCOUNTER — Other Ambulatory Visit: Payer: Self-pay

## 2020-01-11 ENCOUNTER — Encounter: Payer: Self-pay | Admitting: Obstetrics and Gynecology

## 2020-01-11 ENCOUNTER — Ambulatory Visit (INDEPENDENT_AMBULATORY_CARE_PROVIDER_SITE_OTHER): Payer: BC Managed Care – PPO | Admitting: Obstetrics and Gynecology

## 2020-01-11 VITALS — BP 122/78 | Ht 61.0 in | Wt 152.0 lb

## 2020-01-11 DIAGNOSIS — Z79890 Hormone replacement therapy (postmenopausal): Secondary | ICD-10-CM

## 2020-01-11 DIAGNOSIS — Z01419 Encounter for gynecological examination (general) (routine) without abnormal findings: Secondary | ICD-10-CM

## 2020-01-11 MED ORDER — MEDROXYPROGESTERONE ACETATE 10 MG PO TABS: ORAL_TABLET | ORAL | 4 refills | Status: DC

## 2020-01-11 NOTE — Addendum Note (Signed)
Addended by: Dayna Barker on: 01/11/2020 10:51 AM   Modules accepted: Orders

## 2020-01-11 NOTE — Progress Notes (Signed)
Armoni Kludt Oct 03, 1960 [id number]  SUBJECTIVE:  59 y.o. G101P1003 female here for an annual routine gynecologic exam and Pap smear. She has no gynecologic concerns.  Current Outpatient Medications  Medication Sig Dispense Refill  . amLODipine (NORVASC) 10 MG tablet Take 1 tablet (10 mg total) by mouth daily. 90 tablet 4  . Cholecalciferol (VITAMIN D PO) Take by mouth.    . estrogens, conjugated, (PREMARIN) 0.9 MG tablet Take 1 tablet (0.9 mg total) by mouth daily. 90 tablet 4  . fish oil-omega-3 fatty acids 1000 MG capsule Take 1 g by mouth daily.      Marland Kitchen HYDROCHLOROTHIAZIDE PO Take by mouth.    . medroxyPROGESTERone (PROVERA) 10 MG tablet TAKE 1 TABLET DAILY FIRST 13 DAYS OF EACH MONTH 36 tablet 4  . multivitamin (THERAGRAN) per tablet Take 1 tablet by mouth daily.       No current facility-administered medications for this visit.   Allergies: Patient has no known allergies.  No LMP recorded. Patient has had a hysterectomy.  Past medical history,surgical history, problem list, medications, allergies, family history and social history were all reviewed and documented as reviewed in the EPIC chart.  ROS:  Feeling well. No dyspnea or chest pain on exertion.  No abdominal pain, change in bowel habits, black or bloody stools.  No urinary tract symptoms. GYN ROS: menses (in months when taking Provera), no abnormal bleeding, pelvic pain or discharge, no breast pain or new or enlarging lumps on self exam. No neurological complaints.   OBJECTIVE:  BP 122/78   Ht 5\' 1"  (1.549 m)   Wt 152 lb (68.9 kg)   BMI 28.72 kg/m  The patient appears well, alert, oriented x 3, in no distress. ENT normal.  Neck supple. No cervical or supraclavicular adenopathy or thyromegaly.  Lungs are clear, good air entry, no wheezes, rhonchi or rales. S1 and S2 normal, no murmurs, regular rate and rhythm.  Abdomen soft without tenderness, guarding, mass or organomegaly.  Neurological is normal, no focal  findings.  BREAST EXAM: breasts appear normal, no suspicious masses, no skin or nipple changes or axillary nodes  PELVIC EXAM: VULVA: normal appearing vulva with no masses, tenderness or lesions, VAGINA: normal appearing vagina with normal color and discharge, no lesions, CERVIX: Normal cervix, no discharge, UTERUS: surgically absent, ADNEXA: no masses, PAP: Pap smear done today, thin-prep method  Chaperone: Caryn Bee present during the examination  ASSESSMENT:  59 y.o. G1P1003 here for annual gynecologic exam  PLAN:   1. Postmenopausal/HRT. Prior supracervical hysterectomy.  Continues on Premarin 0.9 mg and Provera 10 mg for 13 days each month.  She admits she does not take the Provera every month, and I encouraged her to do so to reduce the risk of estrogen derived malignancies and she still has a cervix and possibility for small amounts of endometrial tissue remaining.  She does request a refill for the Provera only today which is granted for 1 year.  We again reviewed the risks of HRT to include thrombotic diseases, in addition to breast cancer risk.  Will call if she needs more refills.  She does not want to switch to estradiol if her insurance company tries to make her do so. 2. Pap smear 05/2017.  History of dysplasia in 20s with normal Pap smears since then.  Smear is collected today. 3. Mammogram 11/2019.  Normal breast exam today.  She had noticed a lump in her right breast with tenderness and had a diagnostic mammogram which  was normal and just showed fibrocystic change.  Continue routine annual mammography.   4. Colonoscopy 2016.  Recommended that she follow up at the recommended interval.   5. DEXA 11/2007.  Next DEXA recommended in a few years at age 28. 61. Health maintenance.  No labs today as she normally has these completed with her primary care provider.     Return annually or sooner, prn.  Theresia Majors MD 01/11/20

## 2020-01-14 LAB — PAP IG W/ RFLX HPV ASCU

## 2020-01-18 ENCOUNTER — Encounter: Payer: Self-pay | Admitting: Obstetrics and Gynecology

## 2020-01-25 ENCOUNTER — Ambulatory Visit: Payer: BC Managed Care – PPO | Admitting: Obstetrics and Gynecology

## 2020-11-11 ENCOUNTER — Other Ambulatory Visit: Payer: Self-pay | Admitting: Physician Assistant

## 2020-11-13 ENCOUNTER — Telehealth: Payer: Self-pay | Admitting: *Deleted

## 2020-11-13 NOTE — Telephone Encounter (Signed)
Patient called left message about premarin and [facility]. However the message was no clear if she wanted Rx for premarin to mail order.

## 2020-11-17 NOTE — Telephone Encounter (Signed)
Patient called back stating Premarin 0.9 mg tablet, needs PA. Prior Authorization done via [facility] with [facility]. Will wait for response from insurance company.

## 2020-11-18 NOTE — Telephone Encounter (Signed)
Dr.Lavoie this is Dr. Penni Bombard patient) patient insurance company is requiring a letter for medical necessity for patient to take premarin 0.9 mg tablet. Estradiol is the preferred drug on formulary list. Patient has been using premarin tablet for several years now and would like to continue Rx. If you would let me know what you would like the letter to say, I will type letter and send in to insurance company.  Please advise

## 2020-11-18 NOTE — Telephone Encounter (Signed)
[facility] said patient has 2 denials on her file for medication. Since she has 2 denials on file a appeal is the next step, information about appeal along with fax # will be faxed to the office.

## 2020-11-20 NOTE — Telephone Encounter (Signed)
Letter:  HRT for symptomatic menopause, well on Premarin/Provera.

## 2020-11-21 ENCOUNTER — Other Ambulatory Visit: Payer: Self-pay | Admitting: Physician Assistant

## 2020-11-21 DIAGNOSIS — Z1231 Encounter for screening mammogram for malignant neoplasm of breast: Secondary | ICD-10-CM

## 2020-11-25 NOTE — Telephone Encounter (Signed)
Patient called to let Victorino Dike know that [facility] told her the fax # was wrong. She asked if you will resend the letter to Fax# [phone] and she said they said to mark it URGENT.

## 2020-11-26 ENCOUNTER — Encounter: Payer: Self-pay | Admitting: *Deleted

## 2020-11-26 NOTE — Telephone Encounter (Signed)
Letter faxed to below number as requested by patient.

## 2020-12-01 NOTE — Telephone Encounter (Signed)
I received response back from [facility] stating appeal letter was denied stating medication is not medically necessary and patient has not tried other formulary medication such as estradiol.

## 2020-12-18 ENCOUNTER — Ambulatory Visit
Admission: RE | Admit: 2020-12-18 | Discharge: 2020-12-18 | Disposition: A | Discharge status: Home / Self Care | Payer: BC Managed Care – PPO | Source: Ambulatory Visit | Attending: Physician Assistant | Admitting: Physician Assistant

## 2020-12-18 ENCOUNTER — Other Ambulatory Visit: Payer: Self-pay

## 2020-12-18 DIAGNOSIS — Z1231 Encounter for screening mammogram for malignant neoplasm of breast: Secondary | ICD-10-CM

## 2021-01-30 ENCOUNTER — Other Ambulatory Visit: Payer: Self-pay

## 2021-01-30 ENCOUNTER — Encounter: Payer: Self-pay | Admitting: Obstetrics & Gynecology

## 2021-01-30 ENCOUNTER — Other Ambulatory Visit (HOSPITAL_COMMUNITY)
Admission: RE | Admit: 2021-01-30 | Discharge: 2021-01-30 | Disposition: A | Discharge status: Home / Self Care | Payer: BC Managed Care – PPO | Source: Ambulatory Visit | Attending: Obstetrics & Gynecology | Admitting: Obstetrics & Gynecology

## 2021-01-30 ENCOUNTER — Ambulatory Visit (INDEPENDENT_AMBULATORY_CARE_PROVIDER_SITE_OTHER): Payer: BC Managed Care – PPO | Admitting: Obstetrics & Gynecology

## 2021-01-30 VITALS — BP 118/80 | Ht 61.0 in | Wt 148.0 lb

## 2021-01-30 DIAGNOSIS — Z79890 Hormone replacement therapy (postmenopausal): Secondary | ICD-10-CM | POA: Diagnosis not present

## 2021-01-30 DIAGNOSIS — Z01419 Encounter for gynecological examination (general) (routine) without abnormal findings: Secondary | ICD-10-CM | POA: Diagnosis not present

## 2021-01-30 DIAGNOSIS — Z90711 Acquired absence of uterus with remaining cervical stump: Secondary | ICD-10-CM

## 2021-01-30 MED ORDER — PROGESTERONE MICRONIZED 100 MG PO CAPS: 100.0000 mg | ORAL_CAPSULE | Freq: Every day | ORAL | 4 refills | Status: DC

## 2021-01-30 MED ORDER — ESTROGENS CONJUGATED 0.9 MG PO TABS: 0.9000 mg | ORAL_TABLET | Freq: Every day | ORAL | 4 refills | Status: DC

## 2021-01-30 NOTE — Progress Notes (Signed)
Julie Frederick [birth year] [id number]   History:    60 y.o. G1P1L3 Boyfriend  RP:  Established patient presenting for annual gyn exam   HPI: Postmenopausal/HRT. Prior supracervical hysterectomy.  Continues on Premarin 0.9 mg and Provera 10 mg for 13 days each month as she still has a cervix and possibility for small amounts of endometrial tissue remaining.  Pap test neg 2021.  History of dysplasia in 20s with normal Pap smears since then.  Normal breasts s/p Liposuction.  Screening mammo neg 12/2020.  Colonoscopy 2016. DEXA normal in 11/2007.  Health labs with [facility].  BMI 27.96.  Gym 5 times a week.  Past medical history,surgical history, family history and social history were all reviewed and documented in the EPIC chart.  Gynecologic History No LMP recorded. Patient has had a hysterectomy.  Obstetric History OB History  Gravida Para Term Preterm AB Living  1 1 1     3   SAB IAB Ectopic Multiple Live Births        1 3    # Outcome Date GA Lbr Len/2nd Weight Sex Delivery Anes PTL Lv  1A Term     M CS-Unspec  N LIV  1B Term     M CS-Unspec  N LIV  1C Term     M CS-Unspec  N LIV     ROS: A ROS was performed and pertinent positives and negatives are included in the history.  GENERAL: No fevers or chills. HEENT: No change in vision, no earache, sore throat or sinus congestion. NECK: No pain or stiffness. CARDIOVASCULAR: No chest pain or pressure. No palpitations. PULMONARY: No shortness of breath, cough or wheeze. GASTROINTESTINAL: No abdominal pain, nausea, vomiting or diarrhea, melena or bright red blood per rectum. GENITOURINARY: No urinary frequency, urgency, hesitancy or dysuria. MUSCULOSKELETAL: No joint or muscle pain, no back pain, no recent trauma. DERMATOLOGIC: No rash, no itching, no lesions. ENDOCRINE: No polyuria, polydipsia, no heat or cold intolerance. No recent change in weight. HEMATOLOGICAL: No anemia or easy bruising or bleeding. NEUROLOGIC: No headache, seizures,  numbness, tingling or weakness. PSYCHIATRIC: No depression, no loss of interest in normal activity or change in sleep pattern.     Exam:   BP 118/80 (BP Location: Right Arm, Patient Position: Sitting, Cuff Size: Normal)   Ht 5\' 1"  (1.549 m)   Wt 148 lb (67.1 kg)   BMI 27.96 kg/m   Body mass index is 27.96 kg/m.  General appearance : Well developed well nourished female. No acute distress HEENT: Eyes: no retinal hemorrhage or exudates,  Neck supple, trachea midline, no carotid bruits, no thyroidmegaly Lungs: Clear to auscultation, no rhonchi or wheezes, or rib retractions  Heart: Regular rate and rhythm, no murmurs or gallops Breast:Examined in sitting and supine position were symmetrical in appearance, no palpable masses or tenderness,  no skin retraction, no nipple inversion, no nipple discharge, no skin discoloration, no axillary or supraclavicular lymphadenopathy Abdomen: no palpable masses or tenderness, no rebound or guarding Extremities: no edema or skin discoloration or tenderness  Pelvic: Vulva: Normal             Vagina: No gross lesions or discharge  Cervix: No gross lesions or discharge.  Pap/HR HPV, Gono-Chlam done.  Uterus absent  Adnexa  Without masses or tenderness  Anus: Normal   Assessment/Plan:  60 y.o. female for annual exam   1. Encounter for routine gynecological examination with Papanicolaou smear of cervix Gynecologic exam status post supracervical hysterectomy.  Pap test with high-risk HPV and gonorrhea and chlamydia done.  Breast exam normal.  Screening mammogram negative in April 2022.  Colonoscopy 2016. Body mass index 27.96.  Recommend a slightly lower calorie/carb diet.  Aerobic activities 5 times a week and light weightlifting every 2 days. - Cytology - PAP( [place])  2. Postmenopausal hormone replacement therapy Postmenopausal, well on hormone replacement therapy.  No contraindication to continue.  Premarin 0.9 1 tablet p.o. daily.  Decision to  stop the cyclic Provera and switch to Prometrium 100 mg/caps, 1 capsule p.o. at bedtime.  Prescriptions sent to pharmacy.  3. S/P abdominal supracervical subtotal hysterectomy  Other orders - estrogens, conjugated, (PREMARIN) 0.9 MG tablet; Take 1 tablet (0.9 mg total) by mouth daily. - progesterone (PROMETRIUM) 100 MG capsule; Take 1 capsule (100 mg total) by mouth daily.  [name] MD, 8:26 AM 01/30/2021

## 2021-02-04 LAB — CYTOLOGY - PAP
Chlamydia: NEGATIVE
Comment: NEGATIVE
Comment: NEGATIVE
Comment: NORMAL
Diagnosis: NEGATIVE
High risk HPV: NEGATIVE
Neisseria Gonorrhea: NEGATIVE

## 2021-11-23 ENCOUNTER — Other Ambulatory Visit: Payer: Self-pay | Admitting: Physician Assistant

## 2021-11-23 DIAGNOSIS — Z1231 Encounter for screening mammogram for malignant neoplasm of breast: Secondary | ICD-10-CM

## 2021-12-21 ENCOUNTER — Ambulatory Visit: Payer: BC Managed Care – PPO

## 2022-01-01 ENCOUNTER — Ambulatory Visit: Payer: BC Managed Care – PPO

## 2022-01-01 ENCOUNTER — Ambulatory Visit
Admission: RE | Admit: 2022-01-01 | Discharge: 2022-01-01 | Disposition: A | Discharge status: Home / Self Care | Payer: BC Managed Care – PPO | Source: Ambulatory Visit | Attending: Physician Assistant | Admitting: Physician Assistant

## 2022-01-01 DIAGNOSIS — Z1231 Encounter for screening mammogram for malignant neoplasm of breast: Secondary | ICD-10-CM

## 2022-03-26 ENCOUNTER — Ambulatory Visit (INDEPENDENT_AMBULATORY_CARE_PROVIDER_SITE_OTHER): Payer: BC Managed Care – PPO | Admitting: Obstetrics & Gynecology

## 2022-03-26 ENCOUNTER — Other Ambulatory Visit (HOSPITAL_COMMUNITY)
Admission: RE | Admit: 2022-03-26 | Discharge: 2022-03-26 | Disposition: A | Discharge status: Home / Self Care | Payer: BC Managed Care – PPO | Source: Ambulatory Visit | Attending: Obstetrics & Gynecology | Admitting: Obstetrics & Gynecology

## 2022-03-26 ENCOUNTER — Encounter: Payer: Self-pay | Admitting: Obstetrics & Gynecology

## 2022-03-26 VITALS — BP 104/68 | HR 74 | Resp 16 | Ht 60.75 in | Wt 150.0 lb

## 2022-03-26 DIAGNOSIS — Z01419 Encounter for gynecological examination (general) (routine) without abnormal findings: Secondary | ICD-10-CM | POA: Diagnosis not present

## 2022-03-26 DIAGNOSIS — Z90711 Acquired absence of uterus with remaining cervical stump: Secondary | ICD-10-CM | POA: Diagnosis not present

## 2022-03-26 DIAGNOSIS — Z79890 Hormone replacement therapy (postmenopausal): Secondary | ICD-10-CM | POA: Diagnosis not present

## 2022-03-26 MED ORDER — PROGESTERONE 200 MG PO CAPS: 200.0000 mg | ORAL_CAPSULE | Freq: Every day | ORAL | 4 refills | Status: DC

## 2022-03-26 MED ORDER — ESTROGENS CONJUGATED 0.9 MG PO TABS: 0.9000 mg | ORAL_TABLET | Freq: Every day | ORAL | 4 refills | Status: DC

## 2022-03-26 NOTE — Progress Notes (Signed)
Julie Frederick 1960-10-13 [id number]   History:    61 y.o. G1P1L3 Widowed. Boyfriend.  [employer].   RP:  Established patient presenting for annual gyn exam    HPI: Postmenopausal/HRT. Prior supracervical hysterectomy.  Continues on Premarin 0.9 mg and Prometrium 200 mg for 13 days each month as she still has a cervix and possibility for small amounts of endometrial tissue remaining.  Pap test neg 01/2021.  History of dysplasia in 20s with normal Pap smears since then.  Pap reflex today.  Normal breasts s/p Liposuction.  Screening mammo neg 12/2021. Colonoscopy 04/2015. DEXA normal in 10/2021.  Health labs with [facility].  BMI 28.58.  Gym 5 times a week.   Past medical history,surgical history, family history and social history were all reviewed and documented in the EPIC chart.  Gynecologic History No LMP recorded. Patient has had a hysterectomy.  Obstetric History OB History  Gravida Para Term Preterm AB Living  1 1 1     3   SAB IAB Ectopic Multiple Live Births        1 3    # Outcome Date GA Lbr Len/2nd Weight Sex Delivery Anes PTL Lv  1A Term     M CS-Unspec  N LIV  1B Term     M CS-Unspec  N LIV  1C Term     M CS-Unspec  N LIV     ROS: A ROS was performed and pertinent positives and negatives are included in the history. GENERAL: No fevers or chills. HEENT: No change in vision, no earache, sore throat or sinus congestion. NECK: No pain or stiffness. CARDIOVASCULAR: No chest pain or pressure. No palpitations. PULMONARY: No shortness of breath, cough or wheeze. GASTROINTESTINAL: No abdominal pain, nausea, vomiting or diarrhea, melena or bright red blood per rectum. GENITOURINARY: No urinary frequency, urgency, hesitancy or dysuria. MUSCULOSKELETAL: No joint or muscle pain, no back pain, no recent trauma. DERMATOLOGIC: No rash, no itching, no lesions. ENDOCRINE: No polyuria, polydipsia, no heat or cold intolerance. No recent change in weight. HEMATOLOGICAL: No anemia or easy  bruising or bleeding. NEUROLOGIC: No headache, seizures, numbness, tingling or weakness. PSYCHIATRIC: No depression, no loss of interest in normal activity or change in sleep pattern.     Exam:   BP 104/68   Pulse 74   Resp 16   Ht 5' 0.75" (1.543 m)   Wt 150 lb (68 kg)   BMI 28.58 kg/m   Body mass index is 28.58 kg/m.  General appearance : Well developed well nourished female. No acute distress HEENT: Eyes: no retinal hemorrhage or exudates,  Neck supple, trachea midline, no carotid bruits, no thyroidmegaly Lungs: Clear to auscultation, no rhonchi or wheezes, or rib retractions  Heart: Regular rate and rhythm, no murmurs or gallops Breast:Examined in sitting and supine position were symmetrical in appearance, no palpable masses or tenderness,  no skin retraction, no nipple inversion, no nipple discharge, no skin discoloration, no axillary or supraclavicular lymphadenopathy Abdomen: no palpable masses or tenderness, no rebound or guarding Extremities: no edema or skin discoloration or tenderness  Pelvic: Vulva: Normal             Vagina: No gross lesions or discharge  Cervix: No gross lesions or discharge.  Pap reflex done.  Uterus  Absent  Adnexa  Without masses or tenderness  Anus: Normal   Assessment/Plan:  61 y.o. female for annual exam   1. Encounter for routine gynecological examination with Papanicolaou smear  of cervix Postmenopausal/HRT. Prior supracervical hysterectomy.  Continues on Premarin 0.9 mg and Prometrium 200 mg for 13 days each month as she still has a cervix and possibility for small amounts of endometrial tissue remaining.  Pap test neg 01/2021.  History of dysplasia in 20s with normal Pap smears since then.  Pap reflex today.  Normal breasts s/p Liposuction.  Screening mammo neg 12/2021. Colonoscopy 04/2015. DEXA normal in 10/2021.  Health labs with [facility].  BMI 28.58.  Gym 5 times a week. - Cytology - PAP( [place])  2. Postmenopausal hormone replacement  therapy Postmenopausal/HRT. Prior supracervical hysterectomy.  Continues on Premarin 0.9 mg and Prometrium 200 mg for 13 days each month as she still has a cervix and possibility for small amounts of endometrial tissue remaining. BD normal in 10/2021.  3. S/P abdominal supracervical subtotal hysterectomy with BSO  Other orders - hydrochlorothiazide (HYDRODIURIL) 25 MG tablet; Take 1 tablet by mouth daily. - estrogens, conjugated, (PREMARIN) 0.9 MG tablet; Take 1 tablet (0.9 mg total) by mouth daily. - progesterone (PROMETRIUM) 200 MG capsule; Take 1 capsule (200 mg total) by mouth at bedtime for 13 days.   [name] MD, 10:57 AM 03/26/2022

## 2022-03-31 LAB — CYTOLOGY - PAP: Diagnosis: NEGATIVE

## 2022-09-16 ENCOUNTER — Telehealth: Payer: Self-pay | Admitting: *Deleted

## 2022-09-16 NOTE — Telephone Encounter (Signed)
Patient advised would need to contact PCP per Dr. Dellis Filbert.   Encounter closed.

## 2022-09-16 NOTE — Telephone Encounter (Signed)
Patient left voicemail on Triage line requesting refill of amlodipine.   RN returned call to patient. RN advised looks like PCP is prescribing amlodipine for hypertension. Patient states she has made multiple calls and has been unable to get through to request refill. Patient states she is almost out and asking if Dr. Dellis Filbert could prescribe for her? RN advised refill would need to come from PCP, but patient states Dr. Dellis Filbert asked if she wanted her to prescribe at last visit.   Routing to provider for review.  Medication pended for review. Patient requests refill to [facility].

## 2023-02-11 ENCOUNTER — Other Ambulatory Visit: Payer: Self-pay

## 2023-02-11 DIAGNOSIS — Z1231 Encounter for screening mammogram for malignant neoplasm of breast: Secondary | ICD-10-CM

## 2023-02-17 ENCOUNTER — Ambulatory Visit: Payer: BC Managed Care – PPO

## 2023-02-18 ENCOUNTER — Ambulatory Visit
Admission: RE | Admit: 2023-02-18 | Discharge: 2023-02-18 | Disposition: A | Discharge status: Home / Self Care | Payer: BC Managed Care – PPO | Source: Ambulatory Visit | Attending: Physician Assistant | Admitting: Physician Assistant

## 2023-02-18 DIAGNOSIS — Z1231 Encounter for screening mammogram for malignant neoplasm of breast: Secondary | ICD-10-CM

## 2023-10-17 IMAGING — MG MM DIGITAL SCREENING BILAT W/ TOMO AND CAD
8 series · 8 of 24 positions shown · non-contrast
Comparison: Previous exam(s).

CLINICAL DATA: Screening.

EXAM:
DIGITAL SCREENING BILATERAL MAMMOGRAM WITH TOMOSYNTHESIS AND CAD
TECHNIQUE: Bilateral screening digital craniocaudal and mediolateral oblique
mammograms were obtained. Bilateral screening digital breast
tomosynthesis was performed. The images were evaluated with
computer-aided detection.

[R CC synth-2D]
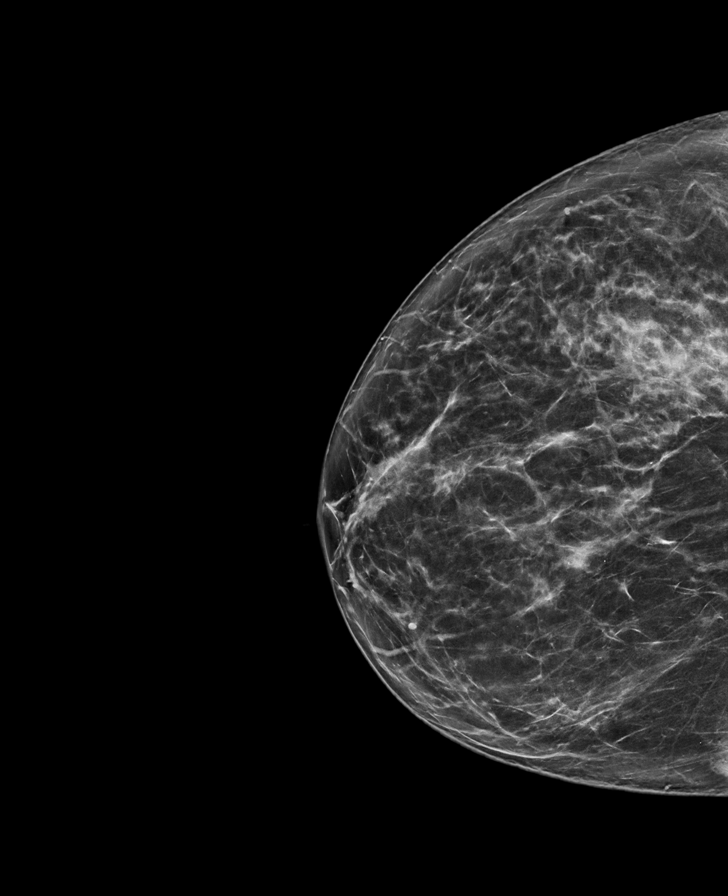

[L MLO synth-2D]
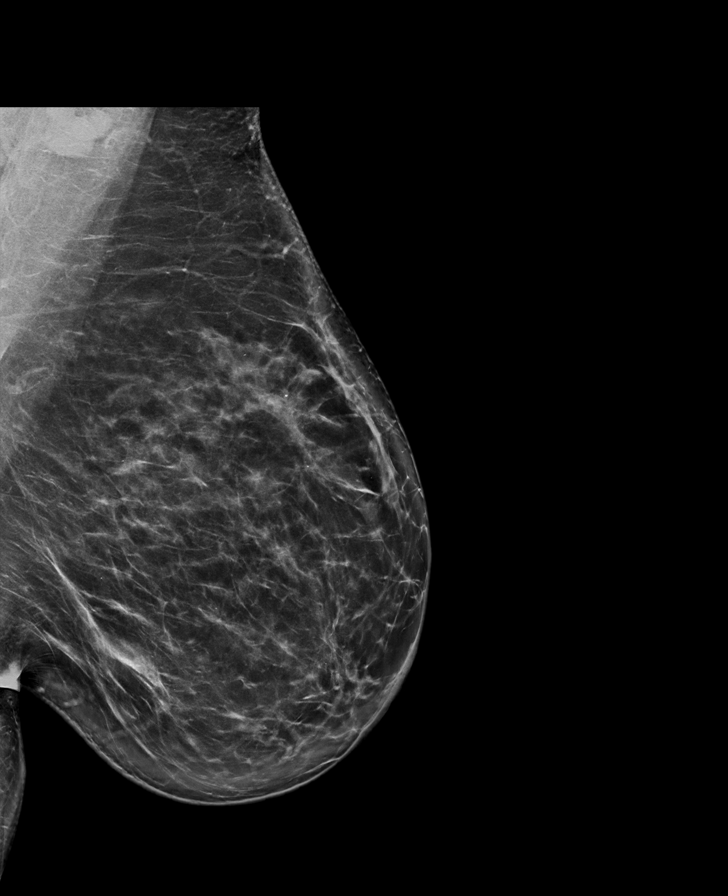

[R MLO synth-2D]
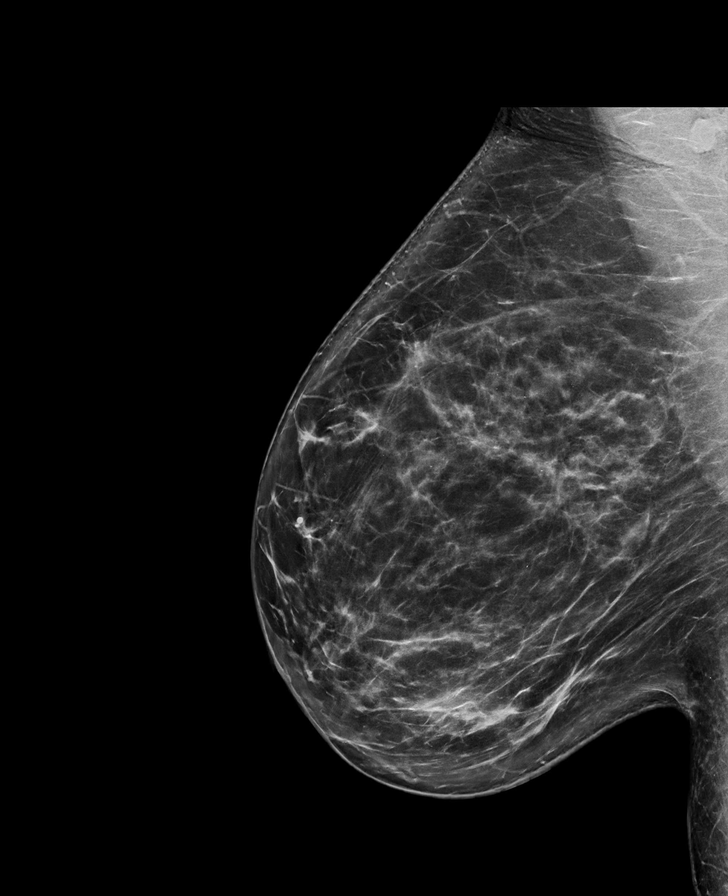

[L CC synth-2D]
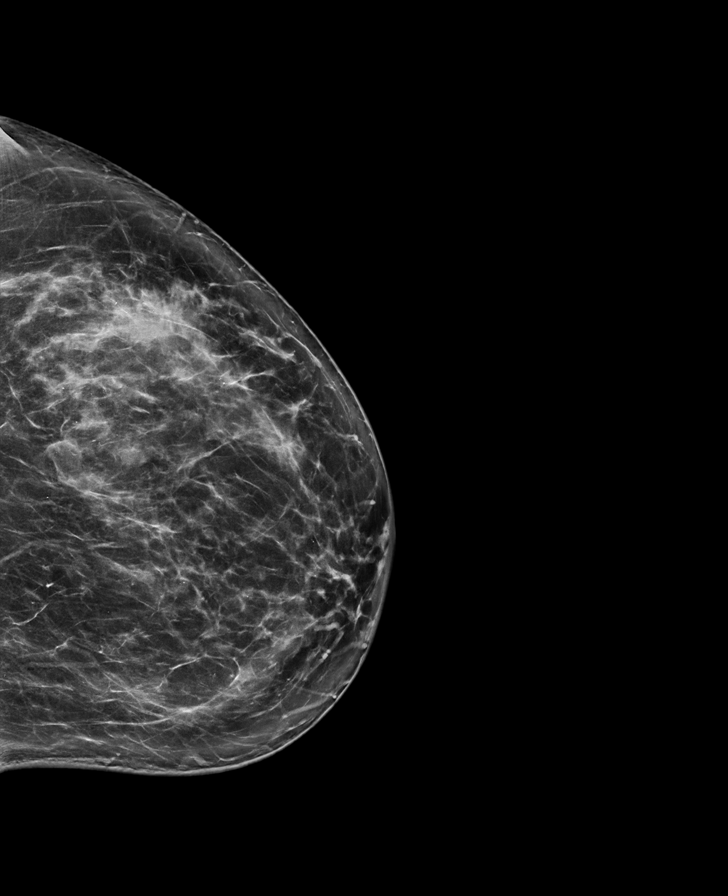

[L MLO tomo · tomo slice 49/97.0]
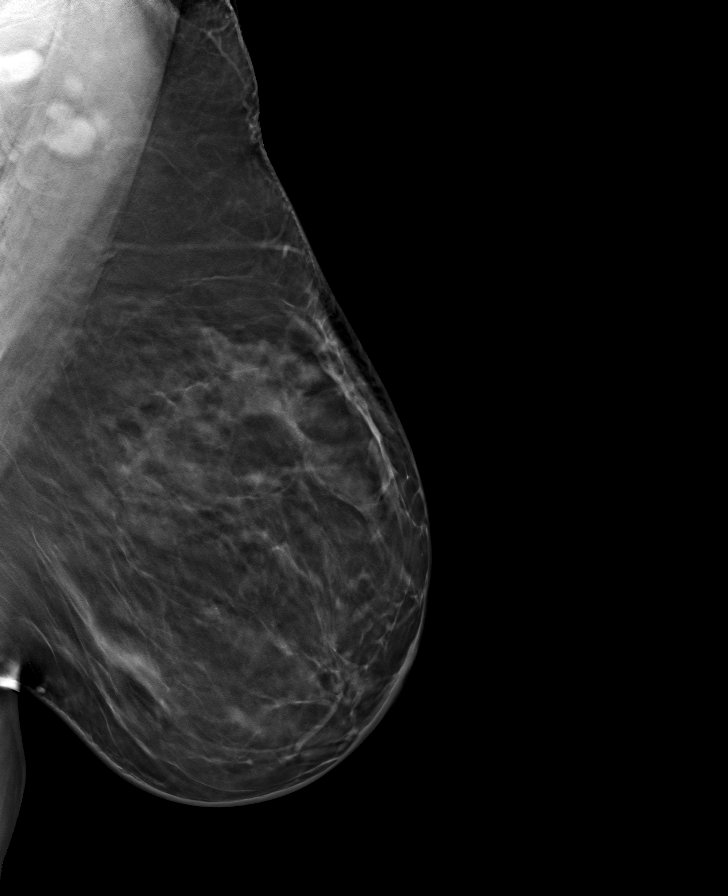

[R MLO tomo · tomo slice 47/93.0]
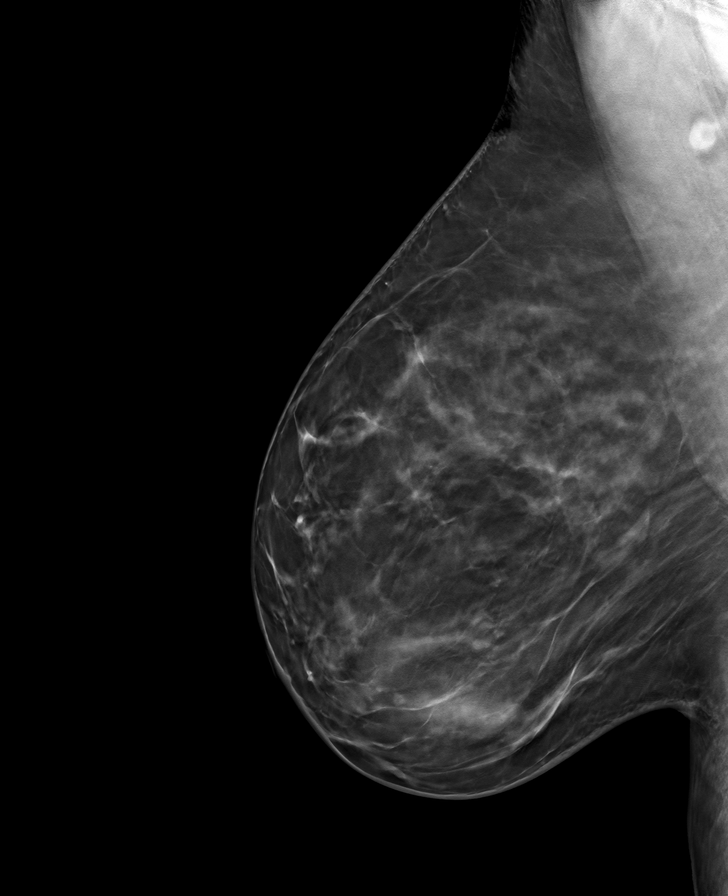

[R CC tomo · tomo slice 41/81.0]
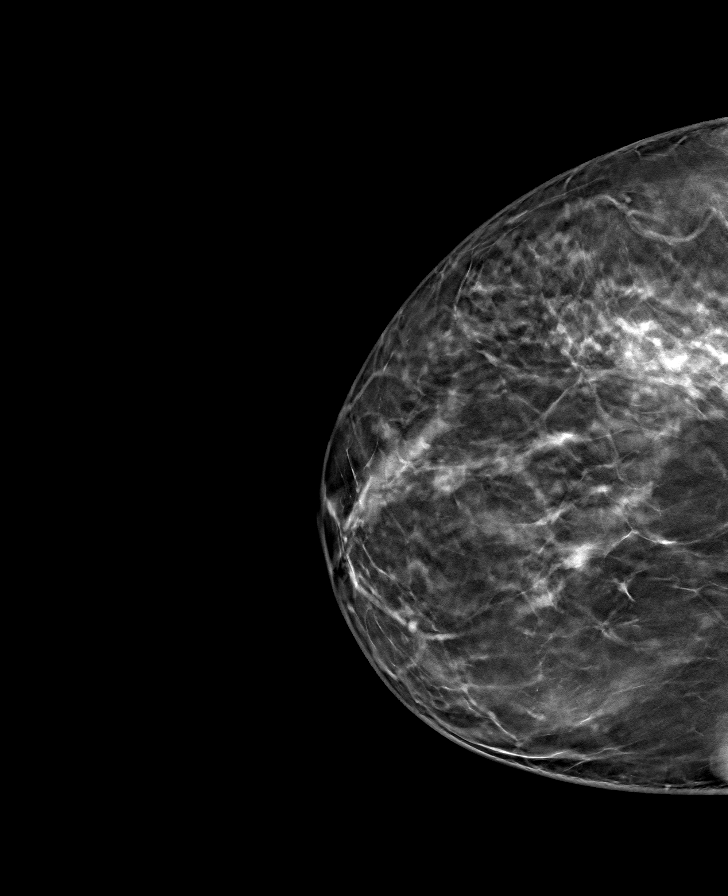

[L CC tomo · tomo slice 43/85.0]
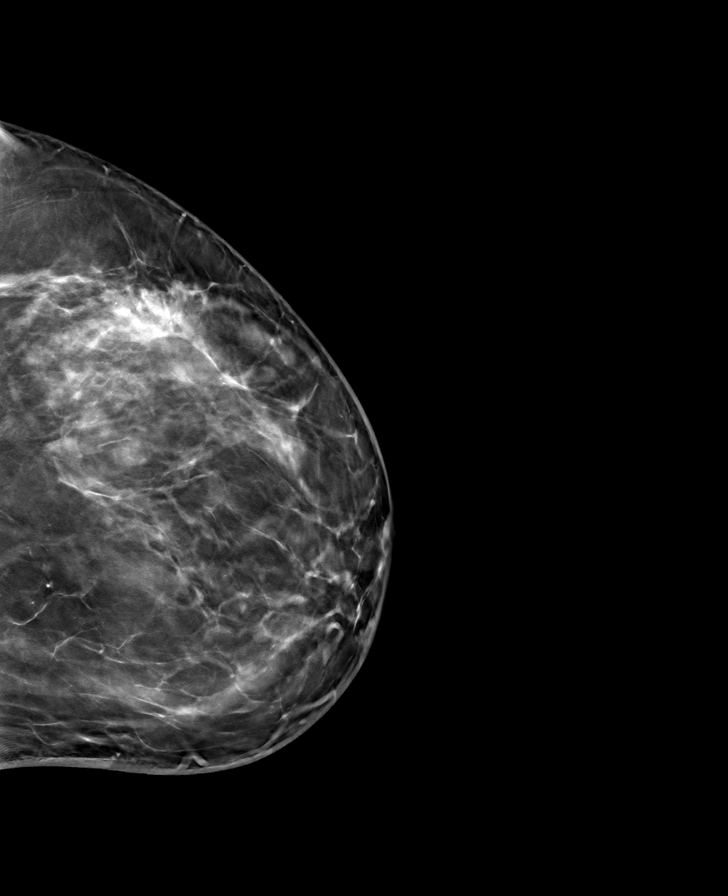

[8 of 24 positions shown; findings below may reference images not displayed]

ACR Breast Density Category c: The breast tissue is heterogeneously
dense, which may obscure small masses.
FINDINGS: There are no findings suspicious for malignancy.
IMPRESSION: No mammographic evidence of malignancy. A result letter of this
screening mammogram will be mailed directly to the patient.

RECOMMENDATION:
Screening mammogram in one year. (Code:Q3-W-BC3)

BI-RADS CATEGORY  1: Negative.

## 2024-02-06 ENCOUNTER — Other Ambulatory Visit: Payer: Self-pay | Admitting: Physician Assistant

## 2024-02-06 DIAGNOSIS — Z0000 Encounter for general adult medical examination without abnormal findings: Secondary | ICD-10-CM

## 2024-02-21 ENCOUNTER — Ambulatory Visit
Admission: RE | Admit: 2024-02-21 | Discharge: 2024-02-21 | Disposition: A | Discharge status: Home / Self Care | Source: Ambulatory Visit | Attending: Physician Assistant | Admitting: Physician Assistant

## 2024-02-21 DIAGNOSIS — Z0000 Encounter for general adult medical examination without abnormal findings: Secondary | ICD-10-CM

## 2024-02-22 ENCOUNTER — Other Ambulatory Visit: Payer: Self-pay | Admitting: *Deleted

## 2024-02-22 NOTE — Telephone Encounter (Signed)
 Med refill request:Premarin  0.9 mg tab PO daily Last AEX: 03/26/22 -ML Next AEX: 03/30/24 -GH Last MMG (if hormonal med) 02/21/24 -BiRads 1 neg  Spoke with patient. Patient states she already had Rx filled by PCP, will further discuss when she comes in for AEX. Patient appreciative of call.   No Rx sent.   Encounter closed.

## 2024-03-30 ENCOUNTER — Ambulatory Visit: Admitting: Obstetrics and Gynecology

## 2024-04-24 NOTE — Progress Notes (Signed)
 63 y.o. G36P1003 female s/p supracervical hysterectomy (cesarean hysterectomy) on HRT with HTN here for annual exam. Widowed. Triplets, 5 grands. Works for [employer].  No LMP recorded. Patient has had a hysterectomy.   She reports no other concerns today. 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  Last PAP:     Component Value Date/Time   DIAGPAP  03/26/2022 1111    - Negative for intraepithelial lesion or malignancy (NILM)   DIAGPAP  01/30/2021 0851    - Negative for intraepithelial lesion or malignancy (NILM)   HPVHIGH Negative 01/30/2021 0851   ADEQPAP Satisfactory for evaluation. 03/26/2022 1111   ADEQPAP  01/30/2021 0851    Satisfactory for evaluation; transformation zone component PRESENT.   Last mammogram: 02/21/24 density C; Bi-Rads 1 Neg Last colonoscopy: 05/02/15 10 year recall DXA: 2023 normal  Exercising: Yes, Zumba 5 days a week Smoker: No  Flowsheet Row Office Visit from 04/25/2024 in [facility]  PHQ-2 Total Score 0      GYN HISTORY: No significant history  OB History  Gravida Para Term Preterm AB Living  1 1 1   3   SAB IAB Ectopic Multiple Live Births     1 3    # Outcome Date GA Lbr Len/2nd Weight Sex Type Anes PTL Lv  1A Term     M CS-Unspec  N LIV  1B Term     M CS-Unspec  N LIV  1C Term     M CS-Unspec  N LIV   Past Medical History:  Diagnosis Date   Cervical dysplasia late 64s   Hypertension    Past Surgical History:  Procedure Laterality Date   BREAST SURGERY     LIPO   CESAREAN SECTION     COLPOSCOPY     HERNIA REPAIR     OOPHORECTOMY     BSO   supercervical hysterectomy  1994   BSO, due to postpartum hemorrhage with placenta accreta   tummy tuck  2010   Current  Outpatient Medications on File Prior to Visit  Medication Sig Dispense Refill   amLODipine  (NORVASC ) 10 MG tablet Take 1 tablet (10 mg total) by mouth daily. (Patient taking differently: Take 5 mg by mouth daily.) 90 tablet 4   Cholecalciferol (VITAMIN D  PO) Take by mouth.     fish oil-omega-3 fatty acids 1000 MG capsule Take 1 g by mouth daily.     hydrochlorothiazide (HYDRODIURIL) 25 MG tablet Take 1 tablet by mouth daily.     multivitamin (THERAGRAN) per tablet Take 1 tablet by mouth daily.     No current facility-administered medications on file prior to visit.   Social History   Socioeconomic History   Marital status: Widowed    Spouse name: Not on file   Number of children: Not on file   Years of education: Not on file   Highest education level: Not on file  Occupational History   Not on file  Tobacco Use   Smoking status: Never   Smokeless tobacco: Never  Vaping Use   Vaping status: Never Used  Substance and Sexual Activity   Alcohol use: Not Currently    Comment: occ   Drug use: No   Sexual activity: Yes    Birth  control/protection: Surgical    Comment: HYST--1st intercourse 63 yo--More than 5 partners  Other Topics Concern   Not on file  Social History Narrative   Not on file   Social Drivers of Health   Financial Resource Strain: Not on file  Food Insecurity: Low Risk  (01/18/2024)   Received from [facility]   Hunger Vital Sign    Within the past 12 months, you worried that your food would run out before you got money to buy more: Never true    Within the past 12 months, the food you bought just didn't last and you didn't have money to get more. : Never true  Transportation Needs: No Transportation Needs (01/18/2024)   Received from [facility]    In the past 12 months, has lack of reliable transportation kept you from medical appointments, meetings, work or from getting things needed for daily living? : No  Physical Activity: Not on file   Stress: Not on file (07/11/2023)  Social Connections: Not on file  Intimate Partner Violence: Low Risk  (12/19/2019)   Received from [facility]   Intimate Partner Violence    Insults You: Not on file    Threatens You: Not on file    Screams at [place]: Not on file    Physically Hurt: Not on file    Intimate Partner Violence Score: Not on file   Family History  Problem Relation Age of Onset   Hypertension Mother    Breast cancer Neg Hx    No Known Allergies   PE Today's Vitals   04/25/24 1332  BP: 100/60  Pulse: 67  Temp: 97.9 F (36.6 C)  TempSrc: Oral  SpO2: 97%  Weight: 150 lb (68 kg)  Height: 5' 1 (1.549 m)   Body mass index is 28.34 kg/m.  Physical Exam Vitals reviewed. Exam conducted with a chaperone present.  Constitutional:      General: She is not in acute distress.    Appearance: Normal appearance.  HENT:     Head: Normocephalic and atraumatic.     Nose: Nose normal.  Eyes:     Extraocular Movements: Extraocular movements intact.     Conjunctiva/sclera: Conjunctivae normal.  Neck:     Thyroid: No thyroid mass, thyromegaly or thyroid tenderness.  Pulmonary:     Effort: Pulmonary effort is normal.  Chest:     Chest wall: No mass or tenderness.  Breasts:    Right: Normal. No swelling, mass, nipple discharge, skin change or tenderness.     Left: Normal. No swelling, mass, nipple discharge, skin change or tenderness.  Abdominal:     General: There is no distension.     Palpations: Abdomen is soft.     Tenderness: There is no abdominal tenderness.  Genitourinary:    General: Normal vulva.     Exam position: Lithotomy position.     Urethra: No prolapse.     Vagina: Normal. No vaginal discharge or bleeding.     Cervix: Normal. No lesion.     Uterus: Absent.      Adnexa: Right adnexa normal and left adnexa normal.  Musculoskeletal:        General: Normal range of motion.     Cervical back: Normal range of motion.  Lymphadenopathy:     Upper Body:      Right upper body: No axillary adenopathy.     Left upper body: No axillary adenopathy.     Lower Body: No right  inguinal adenopathy. No left inguinal adenopathy.  Skin:    General: Skin is warm and dry.  Neurological:     General: No focal deficit present.     Mental Status: She is alert.  Psychiatric:        Mood and Affect: Mood normal.        Behavior: Behavior normal.      Assessment and Plan:        Well woman exam with routine gynecological exam Assessment & Plan: Cervical cancer screening performed according to ASCCP guidelines. Encouraged annual mammogram screening Colonoscopy UTD DXA UTD Labs and immunizations with her primary Encouraged safe sexual practices as indicated Encouraged healthy lifestyle practices with diet and exercise For patients under 50-70yo, I recommend 1200mg  calcium daily and 600IU of vitamin D  daily.    Hormone replacement therapy (HRT) Assessment & Plan: Discussed that she in the extended HRT period Goal for HRT is lowest dose for shortest amount of time Asymptomatic Reviewed risks including DVT, cancer, MI, stroke. Discussed discontinuing vs decreasing dosing She desires to decrease dosing 52yr CV risk 3% Will half premarin  and change Prometrium  to 100mg  at bedtime Discussed plan to decreased yearly, will goal to stop at 63yo.  Orders: -     Estrogens  Conjugated; Take 1 tablet (0.45 mg total) by mouth daily.  Dispense: 90 tablet; Refill: 3 -     Progesterone ; Take 1 capsule (100 mg total) by mouth at bedtime.  Dispense: 90 capsule; Refill: 3  Negative depression screening   Vera LULLA Pa, MD

## 2024-04-25 ENCOUNTER — Ambulatory Visit (INDEPENDENT_AMBULATORY_CARE_PROVIDER_SITE_OTHER): Admitting: Obstetrics and Gynecology

## 2024-04-25 ENCOUNTER — Encounter: Payer: Self-pay | Admitting: Obstetrics and Gynecology

## 2024-04-25 VITALS — BP 100/60 | HR 67 | Temp 97.9°F | Ht 61.0 in | Wt 150.0 lb

## 2024-04-25 DIAGNOSIS — Z79890 Hormone replacement therapy (postmenopausal): Secondary | ICD-10-CM | POA: Insufficient documentation

## 2024-04-25 DIAGNOSIS — Z01419 Encounter for gynecological examination (general) (routine) without abnormal findings: Secondary | ICD-10-CM | POA: Diagnosis not present

## 2024-04-25 DIAGNOSIS — Z1331 Encounter for screening for depression: Secondary | ICD-10-CM | POA: Diagnosis not present

## 2024-04-25 MED ORDER — PROGESTERONE MICRONIZED 100 MG PO CAPS: 100.0000 mg | ORAL_CAPSULE | Freq: Every day | ORAL | 3 refills | Status: AC

## 2024-04-25 MED ORDER — ESTROGENS CONJUGATED 0.45 MG PO TABS: 0.4500 mg | ORAL_TABLET | Freq: Every day | ORAL | 3 refills | Status: AC

## 2024-04-25 NOTE — Assessment & Plan Note (Signed)
 Cervical cancer screening performed according to ASCCP guidelines. Encouraged annual mammogram screening Colonoscopy UTD DXA UTD Labs and immunizations with her primary Encouraged safe sexual practices as indicated Encouraged healthy lifestyle practices with diet and exercise For patients under 50-62yo, I recommend 1200mg  calcium daily and 600IU of vitamin D daily.

## 2024-04-25 NOTE — Assessment & Plan Note (Signed)
 Discussed that she in the extended HRT period Goal for HRT is lowest dose for shortest amount of time Asymptomatic Reviewed risks including DVT, cancer, MI, stroke. Discussed discontinuing vs decreasing dosing She desires to decrease dosing 52yr CV risk 3% Will half premarin  and change Prometrium  to 100mg  at bedtime Discussed plan to decreased yearly, will goal to stop at 63yo.

## 2024-04-25 NOTE — Patient Instructions (Addendum)

## 2024-04-26 ENCOUNTER — Ambulatory Visit: Admitting: Obstetrics and Gynecology

## 2025-04-29 ENCOUNTER — Ambulatory Visit: Admitting: Obstetrics and Gynecology
